# Patient Record
Sex: Female | Born: 1955 | Race: White | Hispanic: No | Smoking: Former smoker
Health system: Southern US, Community
[De-identification: ages and names within clinical notes are randomized; demographics above are authoritative.]

## PROBLEM LIST (undated history)

## (undated) DIAGNOSIS — I10 Essential (primary) hypertension: Secondary | ICD-10-CM

## (undated) DIAGNOSIS — E079 Disorder of thyroid, unspecified: Secondary | ICD-10-CM

## (undated) DIAGNOSIS — E039 Hypothyroidism, unspecified: Secondary | ICD-10-CM

## (undated) DIAGNOSIS — T7840XA Allergy, unspecified, initial encounter: Secondary | ICD-10-CM

## (undated) HISTORY — DX: Disorder of thyroid, unspecified: E07.9

## (undated) HISTORY — DX: Allergy, unspecified, initial encounter: T78.40XA

## (undated) HISTORY — DX: Essential (primary) hypertension: I10

---

## 1998-09-22 ENCOUNTER — Other Ambulatory Visit: Admission: RE | Admit: 1998-09-22 | Discharge: 1998-09-22 | Payer: Self-pay | Admitting: Obstetrics and Gynecology

## 1999-11-11 ENCOUNTER — Other Ambulatory Visit: Admission: RE | Admit: 1999-11-11 | Discharge: 1999-11-11 | Payer: Self-pay | Admitting: Obstetrics and Gynecology

## 2000-11-15 ENCOUNTER — Other Ambulatory Visit: Admission: RE | Admit: 2000-11-15 | Discharge: 2000-11-15 | Payer: Self-pay | Admitting: Obstetrics and Gynecology

## 2001-12-26 ENCOUNTER — Other Ambulatory Visit: Admission: RE | Admit: 2001-12-26 | Discharge: 2001-12-26 | Payer: Self-pay | Admitting: Obstetrics and Gynecology

## 2005-04-14 ENCOUNTER — Ambulatory Visit: Payer: Self-pay | Admitting: Family Medicine

## 2006-04-18 ENCOUNTER — Ambulatory Visit: Payer: Self-pay | Admitting: Family Medicine

## 2007-05-11 ENCOUNTER — Ambulatory Visit: Payer: Self-pay | Admitting: Family Medicine

## 2008-06-13 ENCOUNTER — Ambulatory Visit: Payer: Self-pay | Admitting: Family Medicine

## 2008-07-11 ENCOUNTER — Ambulatory Visit: Payer: Self-pay | Admitting: Gastroenterology

## 2008-07-11 LAB — HM COLONOSCOPY: HM Colonoscopy: NORMAL

## 2009-06-24 ENCOUNTER — Ambulatory Visit: Payer: Self-pay | Admitting: Family Medicine

## 2009-07-02 ENCOUNTER — Ambulatory Visit: Payer: Self-pay | Admitting: Family Medicine

## 2010-07-28 ENCOUNTER — Ambulatory Visit: Payer: Self-pay | Admitting: Family Medicine

## 2010-09-28 ENCOUNTER — Ambulatory Visit: Payer: Self-pay | Admitting: Unknown Physician Specialty

## 2010-10-01 HISTORY — PX: BILATERAL CARPAL TUNNEL RELEASE: SHX6508

## 2011-07-29 ENCOUNTER — Ambulatory Visit: Payer: Self-pay | Admitting: Family Medicine

## 2012-08-01 ENCOUNTER — Ambulatory Visit: Payer: Self-pay | Admitting: Family Medicine

## 2012-10-04 LAB — HM PAP SMEAR: HM Pap smear: NEGATIVE

## 2013-08-02 ENCOUNTER — Ambulatory Visit: Payer: Self-pay | Admitting: Family Medicine

## 2013-10-19 LAB — HEPATIC FUNCTION PANEL
ALT: 17 U/L (ref 7–35)
AST: 15 U/L (ref 13–35)

## 2013-10-19 LAB — LIPID PANEL
Cholesterol: 245 mg/dL — AB (ref 0–200)
HDL: 74 mg/dL — AB (ref 35–70)

## 2014-08-06 ENCOUNTER — Ambulatory Visit: Payer: Self-pay | Admitting: Family Medicine

## 2014-08-06 LAB — HM MAMMOGRAPHY

## 2014-08-24 DIAGNOSIS — E669 Obesity, unspecified: Secondary | ICD-10-CM | POA: Insufficient documentation

## 2014-08-24 DIAGNOSIS — N390 Urinary tract infection, site not specified: Secondary | ICD-10-CM | POA: Insufficient documentation

## 2014-08-24 DIAGNOSIS — Z0000 Encounter for general adult medical examination without abnormal findings: Secondary | ICD-10-CM | POA: Insufficient documentation

## 2014-08-24 DIAGNOSIS — E0500 Thyrotoxicosis with diffuse goiter without thyrotoxic crisis or storm: Secondary | ICD-10-CM | POA: Insufficient documentation

## 2014-08-24 DIAGNOSIS — E559 Vitamin D deficiency, unspecified: Secondary | ICD-10-CM | POA: Insufficient documentation

## 2014-08-24 DIAGNOSIS — I10 Essential (primary) hypertension: Secondary | ICD-10-CM | POA: Insufficient documentation

## 2014-08-24 DIAGNOSIS — B019 Varicella without complication: Secondary | ICD-10-CM | POA: Insufficient documentation

## 2014-10-24 ENCOUNTER — Ambulatory Visit (INDEPENDENT_AMBULATORY_CARE_PROVIDER_SITE_OTHER): Payer: 59 | Admitting: Family Medicine

## 2014-10-24 ENCOUNTER — Encounter: Payer: Self-pay | Admitting: Family Medicine

## 2014-10-24 VITALS — BP 124/82 | HR 84 | Temp 98.0°F | Resp 16 | Ht 63.0 in | Wt 193.0 lb

## 2014-10-24 DIAGNOSIS — I10 Essential (primary) hypertension: Secondary | ICD-10-CM

## 2014-10-24 DIAGNOSIS — R002 Palpitations: Secondary | ICD-10-CM | POA: Diagnosis not present

## 2014-10-24 DIAGNOSIS — E0500 Thyrotoxicosis with diffuse goiter without thyrotoxic crisis or storm: Secondary | ICD-10-CM

## 2014-10-24 DIAGNOSIS — Z0000 Encounter for general adult medical examination without abnormal findings: Secondary | ICD-10-CM | POA: Diagnosis not present

## 2014-10-24 DIAGNOSIS — E559 Vitamin D deficiency, unspecified: Secondary | ICD-10-CM | POA: Diagnosis not present

## 2014-10-24 DIAGNOSIS — Z23 Encounter for immunization: Secondary | ICD-10-CM | POA: Diagnosis not present

## 2014-10-24 LAB — POCT URINALYSIS DIPSTICK
Bilirubin, UA: NEGATIVE
Glucose, UA: NEGATIVE
Ketones, UA: NEGATIVE
LEUKOCYTES UA: NEGATIVE
Nitrite, UA: NEGATIVE
Protein, UA: NEGATIVE
RBC UA: NEGATIVE
Spec Grav, UA: <=1.005
Urobilinogen, UA: 0.2
pH, UA: 6

## 2014-10-24 LAB — IFOBT (OCCULT BLOOD): IFOBT: NEGATIVE

## 2014-10-24 NOTE — Progress Notes (Signed)
Subjective:    Patient ID: Vanessa Austin, female    DOB: 10-27-1955, 59 y.o.   MRN: [id number]  HPI   59 year old female pt comes in today for her Annual Physical Exam.  She reports feeling well with minor issues.  She has a history of heart palpitations.  She denies any changes or worsening symptoms but is considering a cardiology referral for further work up just.  She is sleeping well and has good energy.    Review of Systems  Constitutional: Negative.   HENT: Negative.   Eyes: Negative.   Cardiovascular: Positive for palpitations (Chronic Issues, hasn't had any changes.).  Gastrointestinal: Negative.   Endocrine: Negative.   Genitourinary: Negative.   Musculoskeletal: Positive for joint swelling and arthralgias.  Skin: Negative.   Allergic/Immunologic: Positive for environmental allergies.  Neurological: Negative.   Hematological: Negative.   Psychiatric/Behavioral: Negative.    Family History  Problem Relation Age of Onset  . Hypertension Mother   . Brain cancer Mother   . Hypertension Father   . Hypertension Brother    History   Social History  . Marital Status: Married    Spouse Name: Nadara Mustard  . Number of Children: 3  . Years of Education: College   Occupational History  . [employer]   Social History Main Topics  . Smoking status: Never Smoker   . Smokeless tobacco: Never Used  . Alcohol Use: 4.2 oz/week    7 Glasses of wine per week  . Drug Use: No  . Sexual Activity: Yes   Other Topics Concern  . Not on file   Social History Narrative     Past Surgical History  Procedure Laterality Date  . Bilateral carpal tunnel release Bilateral 10/01/2010    Dr. Leanor Kail     Allergies  Allergen Reactions  . Etodolac   . Penicillins   . Sulfa Antibiotics   . Meloxicam Rash    Current Outpatient Prescriptions on File Prior to Visit  Medication Sig Dispense Refill  . Cetirizine HCl 10 MG CAPS Take 10 mg by mouth daily.    .  Cholecalciferol 10000 UNITS CAPS Take by mouth daily.    . fluticasone (FLONASE) 50 MCG/ACT nasal spray Place 2 sprays into the nose daily.    Marland Kitchen levothyroxine (SYNTHROID, LEVOTHROID) 150 MCG tablet Take 150 mcg by mouth.    Marland Kitchen lisinopril (PRINIVIL,ZESTRIL) 5 MG tablet Take 5 mg by mouth.     No current facility-administered medications on file prior to visit.   Blood pressure 124/82, pulse 84, temperature 98 F (36.7 C), temperature source Oral, resp. rate 16, height _0  (1.6 m), weight 193 lb (87.544 kg).        Objective:   Physical Exam  Constitutional: She appears well-developed and well-nourished.  HENT:  Head: Normocephalic and atraumatic.  Right Ear: Hearing, tympanic membrane, external ear and ear canal normal.  Left Ear: Hearing, tympanic membrane, external ear and ear canal normal.  Nose: Nose normal.  Mouth/Throat: Uvula is midline, oropharynx is clear and moist and mucous membranes are normal.  Eyes: Lids are normal.  Neck: Trachea normal and normal range of motion. Carotid bruit is not present. No thyroid mass and no thyromegaly present.  Cardiovascular: Normal rate, regular rhythm and normal heart sounds.   Pulmonary/Chest: Effort normal and breath sounds normal. Right breast exhibits no inverted nipple, no mass, no nipple discharge, no skin change and no tenderness. Left breast exhibits  mass. Left breast exhibits no inverted nipple, no nipple discharge, no skin change and no tenderness. Breasts are symmetrical.  Abdominal: Soft. Normal appearance and bowel sounds are normal.  Genitourinary: Rectum normal. Guaiac negative stool.  Neurological: She is alert. She has normal strength.  Skin: Skin is warm, dry and intact.  Psychiatric: She has a normal mood and affect. Her behavior is normal.          Assessment & Plan:  1. Annual physical Exam- Healthy female, encouraged healthy diet and exercise.  - POCT Urinalysis Dipstick - IFOBT POC (occult bld, rslt in  office) - Tdap vaccine greater than or equal to 7yo IM  2. Graves disease Check labs. - TSH  3. Vitamin D deficiency Check labs - Vitamin D (25 hydroxy)  4. Essential hypertension Condition is stable. Please continue current medication and  plan of care as noted.   - CBC w/Diff - Comp Met (CMET) - Lipid Profile  5. Heart palpitations Has not had work up previously. Will refer.  - EKG 12-Lead - Ambulatory referral to Cardiology

## 2014-10-29 LAB — CBC WITH DIFFERENTIAL/PLATELET
BASOS ABS: 0 10*3/uL (ref 0.0–0.2)
Basos: 1 %
EOS (ABSOLUTE): 0.1 10*3/uL (ref 0.0–0.4)
Eos: 3 %
HEMOGLOBIN: 13.5 g/dL (ref 11.1–15.9)
Hematocrit: 39.5 % (ref 34.0–46.6)
IMMATURE GRANULOCYTES: 0 %
Immature Grans (Abs): 0 10*3/uL (ref 0.0–0.1)
Lymphocytes Absolute: 1.4 10*3/uL (ref 0.7–3.1)
Lymphs: 31 %
MCH: 30.3 pg (ref 26.6–33.0)
MCHC: 34.2 g/dL (ref 31.5–35.7)
MCV: 89 fL (ref 79–97)
MONOCYTES: 10 %
MONOS ABS: 0.4 10*3/uL (ref 0.1–0.9)
Neutrophils Absolute: 2.6 10*3/uL (ref 1.4–7.0)
Neutrophils: 55 %
Platelets: 263 10*3/uL (ref 150–379)
RBC: 4.45 x10E6/uL (ref 3.77–5.28)
RDW: 13.2 % (ref 12.3–15.4)
WBC: 4.6 10*3/uL (ref 3.4–10.8)

## 2014-10-30 ENCOUNTER — Other Ambulatory Visit: Payer: Self-pay

## 2014-10-30 DIAGNOSIS — E039 Hypothyroidism, unspecified: Secondary | ICD-10-CM | POA: Insufficient documentation

## 2014-10-30 LAB — LIPID PANEL
Chol/HDL Ratio: 3.1 ratio units (ref 0.0–4.4)
Cholesterol, Total: 234 mg/dL — ABNORMAL HIGH (ref 100–199)
HDL: 75 mg/dL (ref >39)
LDL CALC: 143 mg/dL — AB (ref 0–99)
Triglycerides: 82 mg/dL (ref 0–149)
VLDL Cholesterol Cal: 16 mg/dL (ref 5–40)

## 2014-10-30 LAB — TSH: TSH: 0.092 u[IU]/mL — ABNORMAL LOW (ref 0.450–4.500)

## 2014-10-30 LAB — COMPREHENSIVE METABOLIC PANEL
ALBUMIN: 4.3 g/dL (ref 3.5–5.5)
ALT: 19 IU/L (ref 0–32)
AST: 15 IU/L (ref 0–40)
Albumin/Globulin Ratio: 2 (ref 1.1–2.5)
Alkaline Phosphatase: 54 IU/L (ref 39–117)
BUN / CREAT RATIO: 14 (ref 9–23)
BUN: 12 mg/dL (ref 6–24)
Bilirubin Total: 0.4 mg/dL (ref 0.0–1.2)
CHLORIDE: 105 mmol/L (ref 97–108)
CO2: 22 mmol/L (ref 18–29)
Calcium: 9.6 mg/dL (ref 8.7–10.2)
Creatinine, Ser: 0.83 mg/dL (ref 0.57–1.00)
GFR calc Af Amer: 91 mL/min/{1.73_m2} (ref >59)
GFR, EST NON AFRICAN AMERICAN: 79 mL/min/{1.73_m2} (ref >59)
GLUCOSE: 103 mg/dL — AB (ref 65–99)
Globulin, Total: 2.1 g/dL (ref 1.5–4.5)
POTASSIUM: 4.9 mmol/L (ref 3.5–5.2)
SODIUM: 142 mmol/L (ref 134–144)
Total Protein: 6.4 g/dL (ref 6.0–8.5)

## 2014-10-30 LAB — VITAMIN D 25 HYDROXY (VIT D DEFICIENCY, FRACTURES): Vit D, 25-Hydroxy: 36.1 ng/mL (ref 30.0–100.0)

## 2014-10-30 MED ORDER — LEVOTHYROXINE SODIUM 137 MCG PO TABS: 137.0000 ug | ORAL_TABLET | Freq: Every day | ORAL | Status: DC

## 2014-10-30 NOTE — Telephone Encounter (Signed)
-----   Message from Margarita Rana, MD sent at 10/30/2014  6:22 AM EDT ----- Labs stable. Cholesterol elevated but balanced by high good cholesterol.  Thyroid is overcorrected. May contribute to increased heart rate. Recommend decrease Synthroid to 137 and recheck in 8 weeks.  Please clarify pharmacy. Thanks.

## 2014-10-30 NOTE — Telephone Encounter (Signed)
LMTCB 10/30/2014  Thanks,   -Mickel Baas

## 2014-10-30 NOTE — Telephone Encounter (Signed)
Pt advised [facility] to [facility] [address].   Thanks,   -Mickel Baas

## 2014-10-30 NOTE — Telephone Encounter (Signed)
-----   Message from Margarita Rana, MD sent at 10/29/2014 10:35 PM EDT ----- CBC negative.  Please notify patient. Thanks.

## 2014-12-05 ENCOUNTER — Ambulatory Visit: Payer: Self-pay | Admitting: Cardiovascular Disease

## 2014-12-23 ENCOUNTER — Telehealth: Payer: Self-pay | Admitting: Family Medicine

## 2014-12-23 DIAGNOSIS — E039 Hypothyroidism, unspecified: Secondary | ICD-10-CM

## 2014-12-23 NOTE — Telephone Encounter (Signed)
Pt wants to come by and pick up lab order for thyroid text.  Please cal her back when it is ready.   [phone]

## 2014-12-24 NOTE — Telephone Encounter (Signed)
Left message advising pt.   Thanks,   -Raidyn Breiner  

## 2014-12-24 NOTE — Telephone Encounter (Signed)
Printed.  Please notify patient. Thanks.  

## 2015-01-07 ENCOUNTER — Encounter: Payer: Self-pay | Admitting: Cardiovascular Disease

## 2015-01-07 ENCOUNTER — Encounter (INDEPENDENT_AMBULATORY_CARE_PROVIDER_SITE_OTHER): Payer: Self-pay

## 2015-01-07 ENCOUNTER — Ambulatory Visit (INDEPENDENT_AMBULATORY_CARE_PROVIDER_SITE_OTHER): Payer: 59 | Admitting: Cardiovascular Disease

## 2015-01-07 VITALS — BP 127/90 | HR 74 | Ht 62.5 in | Wt 192.5 lb

## 2015-01-07 DIAGNOSIS — R002 Palpitations: Secondary | ICD-10-CM

## 2015-01-07 NOTE — Progress Notes (Signed)
Primary care physician: Dr. Venia Minks  HPI  This is a pleasant 59 year old female who was referred for evaluation of palpitations. She has no previous cardiac history. She has known history of hypothyroidism on replacement, mild hypertension and hyperlipidemia. In June, she started having palpitations mostly at night described as skipping in her heart with occasional tachycardia. She was taking Synthroid 150 g daily. Her labs showed suppressed TSH. The dose was decreased to 137 g daily. Since that time, the patient reports complete resolution of symptoms. She denies any chest pain. She has very mild exertional dyspnea with no recent worsening. There is no family history of coronary artery disease although her father had aortic dissection and CABG later in life. She is not a smoker. She works at [employer].  Allergies  Allergen Reactions  . Etodolac   . Penicillins   . Sulfa Antibiotics   . Meloxicam Rash     Current Outpatient Prescriptions on File Prior to Visit  Medication Sig Dispense Refill  . Cetirizine HCl 10 MG CAPS Take 10 mg by mouth daily.    . Cholecalciferol 10000 UNITS CAPS Take by mouth daily.    . fluticasone (FLONASE) 50 MCG/ACT nasal spray Place 2 sprays into the nose daily.    Marland Kitchen lisinopril (PRINIVIL,ZESTRIL) 5 MG tablet Take 5 mg by mouth.     No current facility-administered medications on file prior to visit.     Past Medical History  Diagnosis Date  . Thyroid disease   . Allergy   . Hypertension      Past Surgical History  Procedure Laterality Date  . Bilateral carpal tunnel release Bilateral 10/01/2010    Dr. Leanor Kail     Family History  Problem Relation Age of Onset  . Hypertension Mother   . Brain cancer Mother   . Hypertension Father   . Heart disease Father   . Hypertension Brother      Social History   Social History  . Marital Status: Married    Spouse Name: Nadara Mustard  . Number of Children: 3  . Years of Education: College    Occupational History  . [employer]   Social History Main Topics  . Smoking status: Never Smoker   . Smokeless tobacco: Never Used  . Alcohol Use: 4.2 oz/week    7 Glasses of wine per week  . Drug Use: No  . Sexual Activity: Yes   Other Topics Concern  . Not on file   Social History Narrative     ROS A 10 point review of system was performed. It is negative other than that mentioned in the history of present illness.   PHYSICAL EXAM   BP 127/90 mmHg  Pulse 74  Ht 5' 2.5" (1.588 m)  Wt 192 lb 8 oz (87.317 kg)  BMI 34.63 kg/m2 Constitutional: She is oriented to person, place, and time. She appears well-developed and well-nourished. No distress.  HENT: No nasal discharge.  Head: Normocephalic and atraumatic.  Eyes: Pupils are equal and round. No discharge.  Neck: Normal range of motion. Neck supple. No JVD present. No thyromegaly present.  Cardiovascular: Normal rate, regular rhythm, normal heart sounds. Exam reveals no gallop and no friction rub. No murmur heard.  Pulmonary/Chest: Effort normal and breath sounds normal. No stridor. No respiratory distress. She has no wheezes. She has no rales. She exhibits no tenderness.  Abdominal: Soft. Bowel sounds are normal. She exhibits no distension. There is no tenderness. There is  no rebound and no guarding.  Musculoskeletal: Normal range of motion. She exhibits no edema and no tenderness.  Neurological: She is alert and oriented to person, place, and time. Coordination normal.  Skin: Skin is warm and dry. No rash noted. She is not diaphoretic. No erythema. No pallor.  Psychiatric: She has a normal mood and affect. Her behavior is normal. Judgment and thought content normal.     EKG: Sinus  Rhythm  WITHIN NORMAL LIMITS   ASSESSMENT AND PLAN

## 2015-01-07 NOTE — Patient Instructions (Signed)
Medication Instructions: Continue same medications.   Labwork: None.   Procedures/Testing: None.   Follow-Up: As needed.   Any Additional Special Instructions Will Be Listed Below (If Applicable).

## 2015-01-07 NOTE — Assessment & Plan Note (Signed)
Was likely due to iatrogenic hyperthyroidism. The symptoms completely resolved after the dose of Synthroid was decreased. She currently reports no palpitations and no other cardiac symptoms. Her cardiac physical exam is normal and baseline ECG is also normal. Thus, the utility of further cardiac testing is very low. If she develops recurrent symptoms after optimizing her thyroid function, then a Holter monitor and echocardiogram can be considered. The patient can follow-up with me as needed.

## 2015-01-08 ENCOUNTER — Telehealth: Payer: Self-pay

## 2015-01-08 DIAGNOSIS — E039 Hypothyroidism, unspecified: Secondary | ICD-10-CM

## 2015-01-08 LAB — TSH: TSH: 0.229 u[IU]/mL — ABNORMAL LOW (ref 0.450–4.500)

## 2015-01-08 MED ORDER — LEVOTHYROXINE SODIUM 125 MCG PO TABS: 125.0000 ug | ORAL_TABLET | Freq: Every day | ORAL | Status: DC

## 2015-01-08 NOTE — Telephone Encounter (Signed)
Advised patient of lab results. Patient verbally understands. Discontinued levothyroxine 15mcg and sent in levothyroxine 125mg  [facility] [address]

## 2015-01-08 NOTE — Telephone Encounter (Signed)
-----   Message from Margarita Rana, MD sent at 01/08/2015  7:36 AM EDT ----- Thyroid improved but not at goal. Recommend decrease levothyroxine to 125 mcg and recheck again in 6 weeks.  Please also clarify pharmacy and put in rx.  Thanks.

## 2015-01-14 ENCOUNTER — Ambulatory Visit: Payer: Self-pay | Admitting: Cardiovascular Disease

## 2015-03-03 ENCOUNTER — Encounter: Payer: Self-pay | Admitting: Family Medicine

## 2015-03-03 DIAGNOSIS — E038 Other specified hypothyroidism: Secondary | ICD-10-CM

## 2015-03-08 ENCOUNTER — Encounter: Payer: Self-pay | Admitting: Family Medicine

## 2015-03-08 LAB — T4 AND TSH
T4 TOTAL: 9.5 ug/dL (ref 4.5–12.0)
TSH: 0.77 u[IU]/mL (ref 0.450–4.500)

## 2015-03-10 ENCOUNTER — Other Ambulatory Visit: Payer: Self-pay

## 2015-03-10 DIAGNOSIS — E039 Hypothyroidism, unspecified: Secondary | ICD-10-CM

## 2015-03-10 MED ORDER — LEVOTHYROXINE SODIUM 125 MCG PO TABS: 125.0000 ug | ORAL_TABLET | Freq: Every day | ORAL | Status: DC

## 2015-03-10 NOTE — Telephone Encounter (Signed)
-----   Message from Margarita Rana, MD sent at 03/08/2015  9:35 AM EDT ----- Thyroid normal.  Thanks.

## 2015-03-10 NOTE — Addendum Note (Signed)
Addended by: Ashley Royalty E on: 03/10/2015 05:02 PM   Modules accepted: Orders

## 2015-05-20 ENCOUNTER — Other Ambulatory Visit: Payer: Self-pay | Admitting: Family Medicine

## 2015-05-20 DIAGNOSIS — E039 Hypothyroidism, unspecified: Secondary | ICD-10-CM

## 2015-05-20 NOTE — Telephone Encounter (Signed)
T4 and TSH checked on 03/07/2015; WNL. Renaldo Fiddler, CMA

## 2015-05-24 ENCOUNTER — Encounter: Payer: Self-pay | Admitting: Family Medicine

## 2015-05-27 MED ORDER — DOXYCYCLINE HYCLATE 100 MG PO TABS: 100.0000 mg | ORAL_TABLET | Freq: Two times a day (BID) | ORAL | Status: DC

## 2015-09-12 ENCOUNTER — Telehealth: Payer: Self-pay | Admitting: Emergency Medicine

## 2015-09-12 NOTE — Telephone Encounter (Signed)
Ok to put in 11 am slot on Tuesday or Thursday. Thanks.

## 2015-09-12 NOTE — Telephone Encounter (Signed)
Pt called requesting a CPE appt with Dr. Venia Minks before she leaves. Looks like all her slots are filled for CPE until she is gone. She wants to know if she can be worked in or if she needs to just make a normal follow up appointment with her. Please advise.   CB# [phone]

## 2015-09-12 NOTE — Telephone Encounter (Signed)
Please review. Thanks!  

## 2015-09-12 NOTE — Telephone Encounter (Signed)
Apt 10/28/2015 at 11am   Thanks,   -Mickel Baas

## 2015-10-28 ENCOUNTER — Ambulatory Visit: Payer: Self-pay | Admitting: Family Medicine

## 2015-11-05 ENCOUNTER — Other Ambulatory Visit: Payer: Self-pay | Admitting: Family Medicine

## 2015-11-12 ENCOUNTER — Encounter: Payer: Self-pay | Admitting: Family Medicine

## 2015-11-12 ENCOUNTER — Ambulatory Visit (INDEPENDENT_AMBULATORY_CARE_PROVIDER_SITE_OTHER): Payer: 59 | Admitting: Family Medicine

## 2015-11-12 VITALS — BP 116/82 | HR 72 | Temp 97.5°F | Resp 16 | Ht 63.5 in | Wt 199.0 lb

## 2015-11-12 DIAGNOSIS — Z124 Encounter for screening for malignant neoplasm of cervix: Secondary | ICD-10-CM | POA: Diagnosis not present

## 2015-11-12 DIAGNOSIS — E7800 Pure hypercholesterolemia, unspecified: Secondary | ICD-10-CM

## 2015-11-12 DIAGNOSIS — Z0000 Encounter for general adult medical examination without abnormal findings: Secondary | ICD-10-CM

## 2015-11-12 DIAGNOSIS — Z1231 Encounter for screening mammogram for malignant neoplasm of breast: Secondary | ICD-10-CM | POA: Diagnosis not present

## 2015-11-12 DIAGNOSIS — Z126 Encounter for screening for malignant neoplasm of bladder: Secondary | ICD-10-CM

## 2015-11-12 DIAGNOSIS — I10 Essential (primary) hypertension: Secondary | ICD-10-CM | POA: Diagnosis not present

## 2015-11-12 DIAGNOSIS — Z1211 Encounter for screening for malignant neoplasm of colon: Secondary | ICD-10-CM | POA: Diagnosis not present

## 2015-11-12 DIAGNOSIS — E039 Hypothyroidism, unspecified: Secondary | ICD-10-CM

## 2015-11-12 DIAGNOSIS — E559 Vitamin D deficiency, unspecified: Secondary | ICD-10-CM | POA: Diagnosis not present

## 2015-11-12 LAB — POCT URINALYSIS DIPSTICK
Bilirubin, UA: NEGATIVE
Glucose, UA: NEGATIVE
KETONES UA: NEGATIVE
Leukocytes, UA: NEGATIVE
Nitrite, UA: NEGATIVE
PH UA: 6
PROTEIN UA: NEGATIVE
RBC UA: NEGATIVE
SPEC GRAV UA: 1.015
UROBILINOGEN UA: 0.2

## 2015-11-12 LAB — IFOBT (OCCULT BLOOD): IMMUNOLOGICAL FECAL OCCULT BLOOD TEST: NEGATIVE

## 2015-11-12 NOTE — Progress Notes (Signed)
Patient: Vanessa Austin, Female    DOB: 1955/08/09, 60 y.o.   MRN: [id number] Visit Date: 11/12/2015  Today's Provider: Margarita Rana, MD   Chief Complaint  Patient presents with  . Annual Exam   Subjective:    Annual physical exam Vanessa Austin is a 60 y.o. female who presents today for health maintenance and complete physical. She feels well. She reports exercising not currently. She reports she is sleeping well.  Last CPE- 10/24/2014 Last Mammogram- 08/06/2014- BI-RADS 1 Last pap- 10/04/2012- Negative; HPV Negative Last colonoscopy- 07/03/2008- Dr. Allen Norris. Normal.  -----------------------------------------------------------------   Review of Systems  Constitutional: Negative.   HENT: Negative.   Eyes: Negative.   Respiratory: Negative.   Cardiovascular: Negative.   Gastrointestinal: Negative.   Endocrine: Negative.   Genitourinary: Negative.   Musculoskeletal: Negative.   Skin: Negative.   Allergic/Immunologic: Negative.   Neurological: Negative.   Hematological: Negative.   Psychiatric/Behavioral: Negative.     Social History      She  reports that she quit smoking about 35 years ago. She has never used smokeless tobacco. She reports that she drinks about 4.2 oz of alcohol per week. She reports that she does not use illicit drugs.       Social History   Social History  . Marital Status: Married    Spouse Name: Nadara Mustard  . Number of Children: 3  . Years of Education: College   Occupational History  . [employer]   Social History Main Topics  . Smoking status: Former Smoker -- 0.25 packs/day for 5 years    Quit date: 05/23/1980  . Smokeless tobacco: Never Used  . Alcohol Use: 4.2 oz/week    7 Glasses of wine per week  . Drug Use: No  . Sexual Activity: Yes   Other Topics Concern  . None   Social History Narrative    Past Medical History  Diagnosis Date  . Thyroid disease   . Allergy   . Hypertension      Patient  Active Problem List   Diagnosis Date Noted  . Hypercholesterolemia 11/12/2015  . Palpitations 01/07/2015  . Hypothyroidism 10/30/2014  . Routine general medical examination at a health care facility 08/24/2014  . Chicken pox 08/24/2014  . 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 BP (high blood pressure) 08/24/2014  . Adiposity 08/24/2014  . Avitaminosis D 08/24/2014  . Carpal tunnel syndrome 12/12/2009  . Female genuine stress incontinence 06/12/2008    Past Surgical History  Procedure Laterality Date  . Bilateral carpal tunnel release Bilateral 10/01/2010    Dr. Leanor Kail    Family History        Family Status  Relation Status Death Age  . Mother Deceased 32  . Father Alive   . Brother Alive   . Brother Alive         Her family history includes Brain cancer in her mother; Heart disease in her father; Hypertension in her brother, father, and mother.    Allergies  Allergen Reactions  . Etodolac   . Penicillins   . Sulfa Antibiotics   . Meloxicam Rash    Current Meds  Medication Sig  . Cetirizine HCl 10 MG CAPS Take 10 mg by mouth daily.  . Cholecalciferol 10000 UNITS CAPS Take by mouth daily.  . fluticasone (FLONASE) 50 MCG/ACT nasal spray Place 2 sprays into the nose daily.  Marland Kitchen levothyroxine (SYNTHROID, LEVOTHROID) 125 MCG tablet take 1  tablet by mouth once daily  . lisinopril (PRINIVIL,ZESTRIL) 5 MG tablet take 1 tablet by mouth once daily    Patient Care Team: Margarita Rana, MD as PCP - General (Family Medicine)     Objective:   Vitals: BP 116/82 mmHg  Pulse 72  Temp(Src) 97.5 F (36.4 C) (Oral)  Resp 16  Ht 5' 3.5" (1.613 m)  Wt 199 lb (90.266 kg)  BMI 34.69 kg/m2   Physical Exam  Constitutional: She is oriented to person, place, and time. She appears well-developed and well-nourished.  HENT:  Head: Normocephalic and atraumatic.  Right Ear: Tympanic membrane, external ear and ear canal normal.  Left Ear: Tympanic membrane, external ear and ear canal  normal.  Nose: Nose normal.  Mouth/Throat: Uvula is midline, oropharynx is clear and moist and mucous membranes are normal.  Eyes: Conjunctivae, EOM and lids are normal. Pupils are equal, round, and reactive to light.  Neck: Trachea normal and normal range of motion. Neck supple. Carotid bruit is not present. No thyroid mass and no thyromegaly present.  Cardiovascular: Normal rate, regular rhythm and normal heart sounds.   Pulmonary/Chest: Effort normal and breath sounds normal.  Abdominal: Soft. Normal appearance and bowel sounds are normal. There is no hepatosplenomegaly. There is no tenderness.  Genitourinary: Vagina normal and uterus normal. Guaiac negative stool. No breast swelling, tenderness or discharge.  Musculoskeletal: Normal range of motion.  Lymphadenopathy:    She has no cervical adenopathy.    She has no axillary adenopathy.  Neurological: She is alert and oriented to person, place, and time. She has normal strength. No cranial nerve deficit.  Skin: Skin is warm, dry and intact.  Psychiatric: She has a normal mood and affect. Her speech is normal and behavior is normal. Judgment and thought content normal. Cognition and memory are normal.     Depression Screen PHQ 2/9 Scores 11/12/2015  PHQ - 2 Score 0      Assessment & Plan:     Routine Health Maintenance and Physical Exam  Exercise Activities and Dietary recommendations Goals    None      Immunization History  Administered Date(s) Administered  . Influenza,inj,Quad PF,36+ Mos 05/13/2015  . Td 04/09/2004  . Tdap 10/24/2014      Discussed health benefits of physical activity, and encouraged her to engage in regular exercise appropriate for her age and condition.    --------------------------------------------------------------------  1. Annual physical exam Stable. Increase exercise.  Results for orders placed or performed in visit on 11/12/15  POCT urinalysis dipstick  Result Value Ref Range    Color, UA yellow    Clarity, UA clear    Glucose, UA Negative    Bilirubin, UA Negative    Ketones, UA Negative    Spec Grav, UA 1.015    Blood, UA Negative    pH, UA 6.0    Protein, UA Negative    Urobilinogen, UA 0.2    Nitrite, UA Negative    Leukocytes, UA Negative Negative  IFOBT POC (occult bld, rslt in office)  Result Value Ref Range   IFOBT Negative      2. Essential hypertension Stable. FU pending labs. - CBC with Differential/Platelet - Comprehensive metabolic panel  3. Hypothyroidism, unspecified hypothyroidism type FU pending labs. - T4 AND TSH  4. Hypercholesterolemia FU pending results. - Lipid panel  5. Bladder cancer screening UA negative. - POCT urinalysis dipstick  6. Encounter for screening mammogram for breast cancer Mammogram ordered today. - MM DIGITAL SCREENING  BILATERAL; Future  7. Avitaminosis D FU pending results. - VITAMIN D 25 Hydroxy (Vit-D Deficiency, Fractures)  8. Cervical cancer screening DU pending results. - Pap IG and HPV (high risk) DNA detection  9. Colon cancer screening OC Lyte negative. - IFOBT POC (occult bld, rslt in office)   Patient seen and examined by Jerrell Belfast, MD, and note scribed by Renaldo Fiddler, CMA.   I have reviewed the document for accuracy and completeness and I agree with above. - Jerrell Belfast, MD    Margarita Rana, MD  [facility]

## 2015-11-14 ENCOUNTER — Encounter: Payer: Self-pay | Admitting: Family Medicine

## 2015-11-14 ENCOUNTER — Telehealth: Payer: Self-pay

## 2015-11-14 NOTE — Telephone Encounter (Signed)
Patient called and wanted to let you know she did labs today and when you send in RX refills for her she needs it 90 day supply please.-aa

## 2015-11-15 LAB — COMPREHENSIVE METABOLIC PANEL
ALBUMIN: 4.6 g/dL (ref 3.5–5.5)
ALK PHOS: 54 IU/L (ref 39–117)
ALT: 24 IU/L (ref 0–32)
AST: 18 IU/L (ref 0–40)
Albumin/Globulin Ratio: 1.8 (ref 1.2–2.2)
BILIRUBIN TOTAL: 0.3 mg/dL (ref 0.0–1.2)
BUN/Creatinine Ratio: 17 (ref 9–23)
BUN: 15 mg/dL (ref 6–24)
CHLORIDE: 102 mmol/L (ref 96–106)
CO2: 22 mmol/L (ref 18–29)
CREATININE: 0.9 mg/dL (ref 0.57–1.00)
Calcium: 9.8 mg/dL (ref 8.7–10.2)
GFR calc Af Amer: 82 mL/min/{1.73_m2} (ref >59)
GFR calc non Af Amer: 71 mL/min/{1.73_m2} (ref >59)
GLUCOSE: 94 mg/dL (ref 65–99)
Globulin, Total: 2.5 g/dL (ref 1.5–4.5)
Potassium: 4.7 mmol/L (ref 3.5–5.2)
Sodium: 141 mmol/L (ref 134–144)
Total Protein: 7.1 g/dL (ref 6.0–8.5)

## 2015-11-15 LAB — LIPID PANEL
CHOLESTEROL TOTAL: 271 mg/dL — AB (ref 100–199)
Chol/HDL Ratio: 3.3 ratio units (ref 0.0–4.4)
HDL: 83 mg/dL (ref >39)
LDL CALC: 171 mg/dL — AB (ref 0–99)
Triglycerides: 86 mg/dL (ref 0–149)
VLDL Cholesterol Cal: 17 mg/dL (ref 5–40)

## 2015-11-15 LAB — CBC WITH DIFFERENTIAL/PLATELET
BASOS ABS: 0 10*3/uL (ref 0.0–0.2)
Basos: 1 %
EOS (ABSOLUTE): 0.1 10*3/uL (ref 0.0–0.4)
Eos: 1 %
HEMOGLOBIN: 14 g/dL (ref 11.1–15.9)
Hematocrit: 41.7 % (ref 34.0–46.6)
Immature Grans (Abs): 0 10*3/uL (ref 0.0–0.1)
Immature Granulocytes: 0 %
LYMPHS ABS: 1.5 10*3/uL (ref 0.7–3.1)
Lymphs: 30 %
MCH: 30.4 pg (ref 26.6–33.0)
MCHC: 33.6 g/dL (ref 31.5–35.7)
MCV: 91 fL (ref 79–97)
MONOCYTES: 8 %
Monocytes Absolute: 0.4 10*3/uL (ref 0.1–0.9)
Neutrophils Absolute: 3.1 10*3/uL (ref 1.4–7.0)
Neutrophils: 60 %
PLATELETS: 278 10*3/uL (ref 150–379)
RBC: 4.6 x10E6/uL (ref 3.77–5.28)
RDW: 13.5 % (ref 12.3–15.4)
WBC: 5.1 10*3/uL (ref 3.4–10.8)

## 2015-11-15 LAB — T4 AND TSH
T4 TOTAL: 7 ug/dL (ref 4.5–12.0)
TSH: 2.2 u[IU]/mL (ref 0.450–4.500)

## 2015-11-15 LAB — VITAMIN D 25 HYDROXY (VIT D DEFICIENCY, FRACTURES): VIT D 25 HYDROXY: 33.6 ng/mL (ref 30.0–100.0)

## 2015-11-17 ENCOUNTER — Telehealth: Payer: Self-pay

## 2015-11-17 DIAGNOSIS — E039 Hypothyroidism, unspecified: Secondary | ICD-10-CM

## 2015-11-17 DIAGNOSIS — I10 Essential (primary) hypertension: Secondary | ICD-10-CM

## 2015-11-17 LAB — PAP IG AND HPV HIGH-RISK
HPV, high-risk: NEGATIVE
PAP SMEAR COMMENT: 0

## 2015-11-17 MED ORDER — LEVOTHYROXINE SODIUM 125 MCG PO TABS: 125.0000 ug | ORAL_TABLET | Freq: Every day | ORAL | Status: DC

## 2015-11-17 MED ORDER — LISINOPRIL 5 MG PO TABS: 5.0000 mg | ORAL_TABLET | Freq: Every day | ORAL | Status: DC

## 2015-11-17 NOTE — Telephone Encounter (Signed)
-----   Message from Margarita Rana, MD sent at 11/15/2015  9:59 AM EDT ----- Labs stable. Except for elevated cholesterol. Really quite high at 271 and LDL at 171.  10 year risk of hear disease is less than 5 percent.  Please see if patient would like to start a medication if has strong family history of heart disease  Or would like to start a medication.  If would like to do lifestyle, Recheck cholesterol in 6 months. Also, please clarify her current medications and send to pharmacy of choice.  Thanks.

## 2015-11-17 NOTE — Telephone Encounter (Signed)
LMTCB 11/17/2015  Thanks,   -Mickel Baas

## 2015-11-17 NOTE — Telephone Encounter (Signed)
Pt advised.  She is going to work on lifestyle changes first.  Also sent RXs' to [facility].  Thanks,   -Mickel Baas

## 2015-11-18 ENCOUNTER — Telehealth: Payer: Self-pay

## 2015-11-18 NOTE — Telephone Encounter (Signed)
Advised pt of lab results. Pt verbally acknowledges understanding. Katerina Zurn Drozdowski, CMA   

## 2015-11-18 NOTE — Telephone Encounter (Signed)
lmtcb Emily Drozdowski, CMA  

## 2015-11-18 NOTE — Telephone Encounter (Signed)
-----   Message from Margarita Rana, MD sent at 11/17/2015 11:50 AM EDT ----- Pap is normal. Please notify patient.

## 2015-12-08 ENCOUNTER — Other Ambulatory Visit: Payer: Self-pay | Admitting: Family Medicine

## 2015-12-08 ENCOUNTER — Other Ambulatory Visit: Payer: Self-pay | Admitting: Physician Assistant

## 2015-12-08 ENCOUNTER — Ambulatory Visit
Admission: RE | Admit: 2015-12-08 | Discharge: 2015-12-08 | Disposition: A | Discharge status: Home / Self Care | Payer: 59 | Source: Ambulatory Visit | Attending: Family Medicine | Admitting: Family Medicine

## 2015-12-08 DIAGNOSIS — Z1231 Encounter for screening mammogram for malignant neoplasm of breast: Secondary | ICD-10-CM

## 2015-12-08 DIAGNOSIS — R928 Other abnormal and inconclusive findings on diagnostic imaging of breast: Secondary | ICD-10-CM | POA: Diagnosis not present

## 2015-12-08 DIAGNOSIS — R921 Mammographic calcification found on diagnostic imaging of breast: Secondary | ICD-10-CM | POA: Insufficient documentation

## 2015-12-12 ENCOUNTER — Ambulatory Visit
Admission: RE | Admit: 2015-12-12 | Discharge: 2015-12-12 | Disposition: A | Discharge status: Home / Self Care | Payer: 59 | Source: Ambulatory Visit | Attending: Physician Assistant | Admitting: Physician Assistant

## 2015-12-12 ENCOUNTER — Other Ambulatory Visit: Payer: Self-pay | Admitting: Physician Assistant

## 2015-12-12 DIAGNOSIS — R928 Other abnormal and inconclusive findings on diagnostic imaging of breast: Secondary | ICD-10-CM | POA: Diagnosis not present

## 2015-12-12 DIAGNOSIS — R921 Mammographic calcification found on diagnostic imaging of breast: Secondary | ICD-10-CM | POA: Diagnosis not present

## 2015-12-16 ENCOUNTER — Other Ambulatory Visit: Payer: Self-pay | Admitting: Physician Assistant

## 2015-12-16 DIAGNOSIS — R928 Other abnormal and inconclusive findings on diagnostic imaging of breast: Secondary | ICD-10-CM

## 2015-12-17 ENCOUNTER — Ambulatory Visit
Admission: RE | Admit: 2015-12-17 | Discharge: 2015-12-17 | Disposition: A | Discharge status: Home / Self Care | Payer: 59 | Source: Ambulatory Visit | Attending: Physician Assistant | Admitting: Physician Assistant

## 2015-12-17 ENCOUNTER — Other Ambulatory Visit: Payer: Self-pay | Admitting: Physician Assistant

## 2015-12-17 DIAGNOSIS — R928 Other abnormal and inconclusive findings on diagnostic imaging of breast: Secondary | ICD-10-CM

## 2015-12-18 ENCOUNTER — Other Ambulatory Visit: Payer: Self-pay | Admitting: Physician Assistant

## 2015-12-18 DIAGNOSIS — N6021 Fibroadenosis of right breast: Secondary | ICD-10-CM

## 2015-12-18 DIAGNOSIS — D241 Benign neoplasm of right breast: Secondary | ICD-10-CM

## 2015-12-19 ENCOUNTER — Telehealth: Payer: Self-pay | Admitting: *Deleted

## 2015-12-19 NOTE — Telephone Encounter (Signed)
Patient called office requesting to speak to [facility] about referral to General Surgery. Patient stated that she has some questions. Please advise?

## 2015-12-19 NOTE — Telephone Encounter (Signed)
Pt called to make sure that Vanessa Austin was in today b/c she hasn't heard back today. I advised that Vanessa Austin has had a full schedule today. Please advise. Thanks TNP

## 2015-12-19 NOTE — Telephone Encounter (Signed)
Called and spoke with patient. All answers were sought and answered. She has appt with Dr. Rochel Brome on 01/07/16

## 2015-12-29 ENCOUNTER — Telehealth: Payer: Self-pay | Admitting: Physician Assistant

## 2015-12-29 NOTE — Telephone Encounter (Signed)
Spoke with patient and reports that where she had the needle biopsy on her breast that she has notice a lump. It doesn't hurt, or feels hot to touch. She does reports that the lump is getting smaller.She just wants to know if there's anything she needs to worry about. She has a follow-up appointment with surgeon next week. Please advised.  Thanks,  -Jeanenne Licea

## 2015-12-29 NOTE — Telephone Encounter (Signed)
Patient advised as directed below.  Thanks,  -Remiel Corti 

## 2015-12-29 NOTE — Telephone Encounter (Signed)
Pt would like to speak to you about a lump where she had the needle biopsy on her breast.  She is suppose to see Dr. Tollie Pizza the surgeon but has not yet.  She has an appt next week.with him.  Her call back is [phone]  Thanks,. teri

## 2015-12-29 NOTE — Telephone Encounter (Signed)
Just have her watch for increasing redness, drainage, tenderness, warm to touch. She may apply moist heat to the area to help the body reabsorb. May be scar tissue developing from the biopsy.

## 2016-01-07 ENCOUNTER — Other Ambulatory Visit: Payer: Self-pay | Admitting: Surgery

## 2016-01-07 DIAGNOSIS — N63 Unspecified lump in unspecified breast: Secondary | ICD-10-CM

## 2016-01-08 ENCOUNTER — Other Ambulatory Visit: Payer: Self-pay | Admitting: *Deleted

## 2016-01-08 ENCOUNTER — Inpatient Hospital Stay
Admission: RE | Admit: 2016-01-08 | Discharge: 2016-01-08 | Disposition: A | Discharge status: Home / Self Care | Payer: Self-pay | Source: Ambulatory Visit | Attending: *Deleted | Admitting: *Deleted

## 2016-01-08 DIAGNOSIS — Z9289 Personal history of other medical treatment: Secondary | ICD-10-CM

## 2016-01-20 ENCOUNTER — Encounter
Admission: RE | Admit: 2016-01-20 | Discharge: 2016-01-20 | Disposition: A | Discharge status: Home / Self Care | Payer: 59 | Source: Ambulatory Visit | Attending: Surgery | Admitting: Surgery

## 2016-01-20 HISTORY — DX: Hypothyroidism, unspecified: E03.9

## 2016-01-20 NOTE — Patient Instructions (Signed)
  Your procedure is scheduled on: 01/27/16 Report to Mammography.  0820 AM   Remember: Instructions that are not followed completely may result in serious medical risk, up to and including death, or upon the discretion of your surgeon and anesthesiologist your surgery may need to be rescheduled.    __X__ 1. Do not eat food or drink liquids after midnight. No gum chewing or hard candies.     _X___ 2. No Alcohol for 24 hours before or after surgery.   ____ 3. Do Not Smoke For 24 Hours Prior to Your Surgery.   ____ 4. Bring all medications with you on the day of surgery if instructed.    __X__ 5. Notify your doctor if there is any change in your medical condition     (cold, fever, infections).       Do not wear jewelry, make-up, hairpins, clips or nail polish.  Do not wear lotions, powders, or perfumes. You may wear deodorant.  Do not shave 48 hours prior to surgery. Men may shave face and neck.  Do not bring valuables to the hospital.    [facility] is not responsible for any belongings or valuables.               Contacts, dentures or bridgework may not be worn into surgery.  Leave your suitcase in the car. After surgery it may be brought to your room.  For patients admitted to the hospital, discharge time is determined by your                treatment team.   Patients discharged the day of surgery will not be allowed to drive home.   Please read over the following fact sheets that you were given:   Surgical Site Infection Prevention   ___X_ Take these medicines the morning of surgery with A SIP OF WATER:    1. LEVOTHYROXINE  2. LISINOPRIL  3.   4.  5.  6.  ____ Fleet Enema (as directed)   __X__ Use CHG Soap as directed  ____ Use inhalers on the day of surgery  ____ Stop metformin 2 days prior to surgery    ____ Take 1/2 of usual insulin dose the night before surgery and none on the morning of surgery.   ____ Stop Coumadin/Plavix/aspirin on   ____ Stop  Anti-inflammatories on    ____ Stop supplements until after surgery.    ____ Bring C-Pap to the hospital.

## 2016-01-27 ENCOUNTER — Ambulatory Visit: Payer: 59 | Admitting: Anesthesiology

## 2016-01-27 ENCOUNTER — Ambulatory Visit
Admission: RE | Admit: 2016-01-27 | Discharge: 2016-01-27 | Disposition: A | Discharge status: Home / Self Care | Payer: 59 | Source: Ambulatory Visit | Attending: Surgery | Admitting: Surgery

## 2016-01-27 ENCOUNTER — Encounter
Admission: RE | Disposition: A | Discharge status: Home / Self Care | Payer: Self-pay | Source: Ambulatory Visit | Attending: Surgery

## 2016-01-27 ENCOUNTER — Encounter: Payer: Self-pay | Admitting: *Deleted

## 2016-01-27 ENCOUNTER — Other Ambulatory Visit: Payer: Self-pay | Admitting: Surgery

## 2016-01-27 DIAGNOSIS — D241 Benign neoplasm of right breast: Secondary | ICD-10-CM | POA: Insufficient documentation

## 2016-01-27 DIAGNOSIS — Z808 Family history of malignant neoplasm of other organs or systems: Secondary | ICD-10-CM | POA: Diagnosis not present

## 2016-01-27 DIAGNOSIS — E0590 Thyrotoxicosis, unspecified without thyrotoxic crisis or storm: Secondary | ICD-10-CM | POA: Insufficient documentation

## 2016-01-27 DIAGNOSIS — Z87891 Personal history of nicotine dependence: Secondary | ICD-10-CM | POA: Diagnosis not present

## 2016-01-27 DIAGNOSIS — N63 Unspecified lump in unspecified breast: Secondary | ICD-10-CM

## 2016-01-27 DIAGNOSIS — E039 Hypothyroidism, unspecified: Secondary | ICD-10-CM | POA: Diagnosis not present

## 2016-01-27 DIAGNOSIS — Z79899 Other long term (current) drug therapy: Secondary | ICD-10-CM | POA: Insufficient documentation

## 2016-01-27 DIAGNOSIS — Z880 Allergy status to penicillin: Secondary | ICD-10-CM | POA: Insufficient documentation

## 2016-01-27 DIAGNOSIS — Z882 Allergy status to sulfonamides status: Secondary | ICD-10-CM | POA: Diagnosis not present

## 2016-01-27 DIAGNOSIS — Z888 Allergy status to other drugs, medicaments and biological substances status: Secondary | ICD-10-CM | POA: Insufficient documentation

## 2016-01-27 DIAGNOSIS — Z8249 Family history of ischemic heart disease and other diseases of the circulatory system: Secondary | ICD-10-CM | POA: Insufficient documentation

## 2016-01-27 DIAGNOSIS — I10 Essential (primary) hypertension: Secondary | ICD-10-CM | POA: Insufficient documentation

## 2016-01-27 HISTORY — PX: BREAST EXCISIONAL BIOPSY: SUR124

## 2016-01-27 HISTORY — PX: BREAST LUMPECTOMY WITH NEEDLE LOCALIZATION: SHX5759

## 2016-01-27 SURGERY — BREAST LUMPECTOMY WITH NEEDLE LOCALIZATION
Anesthesia: General | Laterality: Right | Wound class: Clean

## 2016-01-27 MED ORDER — ONDANSETRON HCL 4 MG/2ML IJ SOLN
INTRAMUSCULAR | Status: DC | PRN
  Administered 2016-01-27: 4 mg via INTRAVENOUS

## 2016-01-27 MED ORDER — LIDOCAINE HCL (CARDIAC) 20 MG/ML IV SOLN
INTRAVENOUS | Status: DC | PRN
  Administered 2016-01-27: 50 mg via INTRAVENOUS

## 2016-01-27 MED ORDER — MIDAZOLAM HCL 2 MG/2ML IJ SOLN
INTRAMUSCULAR | Status: DC | PRN
  Administered 2016-01-27: 2 mg via INTRAVENOUS

## 2016-01-27 MED ORDER — LACTATED RINGERS IV SOLN
INTRAVENOUS | Status: DC
  Administered 2016-01-27: 09:00:00 via INTRAVENOUS

## 2016-01-27 MED ORDER — EPHEDRINE SULFATE 50 MG/ML IJ SOLN
INTRAMUSCULAR | Status: DC | PRN
  Administered 2016-01-27 (×2): 10 mg via INTRAVENOUS

## 2016-01-27 MED ORDER — FENTANYL CITRATE (PF) 100 MCG/2ML IJ SOLN
INTRAMUSCULAR | Status: DC | PRN
  Administered 2016-01-27 (×2): 50 ug via INTRAVENOUS

## 2016-01-27 MED ORDER — BUPIVACAINE-EPINEPHRINE (PF) 0.5% -1:200000 IJ SOLN
INTRAMUSCULAR | Status: AC
  Filled 2016-01-27: qty 30

## 2016-01-27 MED ORDER — DEXAMETHASONE SODIUM PHOSPHATE 10 MG/ML IJ SOLN
INTRAMUSCULAR | Status: DC | PRN
  Administered 2016-01-27: 10 mg via INTRAVENOUS

## 2016-01-27 MED ORDER — PROPOFOL 10 MG/ML IV BOLUS
INTRAVENOUS | Status: DC | PRN
  Administered 2016-01-27: 200 mg via INTRAVENOUS

## 2016-01-27 MED ORDER — FENTANYL CITRATE (PF) 100 MCG/2ML IJ SOLN
INTRAMUSCULAR | Status: AC
  Filled 2016-01-27: qty 2

## 2016-01-27 MED ORDER — HYDROCODONE-ACETAMINOPHEN 5-325 MG PO TABS: 1.0000 | ORAL_TABLET | ORAL | 0 refills | Status: DC | PRN

## 2016-01-27 MED ORDER — ONDANSETRON HCL 4 MG/2ML IJ SOLN: 4.0000 mg | Freq: Once | INTRAMUSCULAR | Status: DC | PRN

## 2016-01-27 MED ORDER — FAMOTIDINE 20 MG PO TABS
ORAL_TABLET | ORAL | Status: AC
  Administered 2016-01-27: 20 mg via ORAL
  Filled 2016-01-27: qty 1

## 2016-01-27 MED ORDER — HYDROCODONE-ACETAMINOPHEN 5-325 MG PO TABS
1.0000 | ORAL_TABLET | ORAL | Status: DC | PRN
  Administered 2016-01-27: 1 via ORAL

## 2016-01-27 MED ORDER — BUPIVACAINE-EPINEPHRINE 0.5% -1:200000 IJ SOLN
INTRAMUSCULAR | Status: DC | PRN
  Administered 2016-01-27: 10 mL

## 2016-01-27 MED ORDER — HYDROCODONE-ACETAMINOPHEN 5-325 MG PO TABS
ORAL_TABLET | ORAL | Status: AC
  Filled 2016-01-27: qty 1

## 2016-01-27 MED ORDER — FAMOTIDINE 20 MG PO TABS
20.0000 mg | ORAL_TABLET | Freq: Once | ORAL | Status: AC
  Administered 2016-01-27: 20 mg via ORAL

## 2016-01-27 MED ORDER — FENTANYL CITRATE (PF) 100 MCG/2ML IJ SOLN
25.0000 ug | INTRAMUSCULAR | Status: DC | PRN
  Administered 2016-01-27 (×4): 25 ug via INTRAVENOUS

## 2016-01-27 SURGICAL SUPPLY — 25 items
BLADE SURG 15 STRL LF DISP TIS (BLADE) ×1 IMPLANT
BLADE SURG 15 STRL SS (BLADE) ×2
CANISTER SUCT 1200ML W/VALVE (MISCELLANEOUS) ×3 IMPLANT
CHLORAPREP W/TINT 26ML (MISCELLANEOUS) ×3 IMPLANT
DEVICE DUBIN SPECIMEN MAMMOGRA (MISCELLANEOUS) ×3 IMPLANT
DRAPE LAPAROTOMY 77X122 PED (DRAPES) ×3 IMPLANT
ELECT REM PT RETURN 9FT ADLT (ELECTROSURGICAL) ×3
ELECTRODE REM PT RTRN 9FT ADLT (ELECTROSURGICAL) ×1 IMPLANT
GLOVE BIO SURGEON STRL SZ7.5 (GLOVE) ×18 IMPLANT
GOWN STRL REUS W/ TWL LRG LVL3 (GOWN DISPOSABLE) ×2 IMPLANT
GOWN STRL REUS W/TWL LRG LVL3 (GOWN DISPOSABLE) ×4
KIT RM TURNOVER STRD PROC AR (KITS) ×3 IMPLANT
LABEL OR SOLS (LABEL) ×3 IMPLANT
LIQUID BAND (GAUZE/BANDAGES/DRESSINGS) ×3 IMPLANT
MARGIN MAP 10MM (MISCELLANEOUS) ×3 IMPLANT
NEEDLE HYPO 25X1 1.5 SAFETY (NEEDLE) ×3 IMPLANT
PACK BASIN MINOR ARMC (MISCELLANEOUS) ×3 IMPLANT
SUT CHROMIC 4 0 RB 1X27 (SUTURE) ×3 IMPLANT
SUT ETHILON 3-0 FS-10 30 BLK (SUTURE) ×3
SUT MNCRL 4-0 (SUTURE) ×2
SUT MNCRL 4-0 27XMFL (SUTURE) ×1
SUTURE EHLN 3-0 FS-10 30 BLK (SUTURE) ×1 IMPLANT
SUTURE MNCRL 4-0 27XMF (SUTURE) ×1 IMPLANT
SYRINGE 10CC LL (SYRINGE) ×3 IMPLANT
WATER STERILE IRR 1000ML POUR (IV SOLUTION) ×3 IMPLANT

## 2016-01-27 NOTE — Consult Note (Signed)
  She comes in today for excision of 2 right breast masses. She has had preoperative x-ray needle localization with insertion of 2 wires. I have reviewed the mammogram images. 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  The right side was marked YES  Lab work reviewed  I discussed the plan for excision of the 2 right breast masses. Also discussed perioperative care.

## 2016-01-27 NOTE — Discharge Instructions (Addendum)
Take Tylenol or Norco if needed for pain.  Should not drive or do anything dangerous when taking Norco.  May shower.  Wear bra as desired for comfort and support.  Lumpectomy, Care After Refer to this sheet in the next few weeks. These instructions provide you with information on caring for yourself after your procedure. Your health care provider may also give you more specific instructions. Your treatment has been planned according to current medical practices, but problems sometimes occur. Call your health care provider if you have any problems or questions after your procedure. WHAT TO EXPECT AFTER THE PROCEDURE After your procedure, it is typical to have soreness, bruising, and swelling of your breast. This is normal. You will be given medicines to control your pain. HOME CARE INSTRUCTIONS  Take medicines only as directed by your health care provider.  Resume a normal diet as directed by your health care provider.  Resume normal activity as directed by your health care provider. Avoid strenuous activity that affects the arm on the side that the surgical cut (incision) was made. Avoid playing tennis, swimming, lifting heavy objects (those that weigh more than 10 pounds [4.5 kg]), and pulling for 2 weeks.  Change bandages (dressings) as directed by your health care provider.  Consider wearing a bra to bed if you feel discomfort at the breast. Wearing a bra also helps keep dressings on.  Keep all follow-up visits as directed by your health care provider. This is important.  Call for the results of your procedure as instructed by your surgeon. It is your responsibility to get your test results. Do not assume everything is fine if you have not heard from your health care provider.  Keep the incision site dry.  If the incision site is tender, applying an ice pack may relieve some discomfort. To do this:  Put ice in a plastic bag.  Place a towel between your skin and the bag.  Leave  the ice on for 15-20 minutes, 3-4 times a day. SEEK MEDICAL CARE IF:   You have increased bleeding from the incision site.  You notice redness, swelling, or increasing pain in the incision.  You have pus coming from the incision site.  You have a fever.  You notice a foul smell coming from the incision or dressing. SEEK IMMEDIATE MEDICAL CARE IF:   You develop a rash.  You have shortness of breath.  You have chest pain.   This information is not intended to replace advice given to you by your health care provider. Make sure you discuss any questions you have with your health care provider.   Document Released: 05/26/2006 Document Revised: 05/31/2014 Document Reviewed: 12/07/2012 Elsevier Interactive Patient Education 2016 [place] Anesthesia, Adult, Care After Refer to this sheet in the next few weeks. These instructions provide you with information on caring for yourself after your procedure. Your health care provider may also give you more specific instructions. Your treatment has been planned according to current medical practices, but problems sometimes occur. Call your health care provider if you have any problems or questions after your procedure. WHAT TO EXPECT AFTER THE PROCEDURE After the procedure, it is typical to experience:  Sleepiness.  Nausea and vomiting. HOME CARE INSTRUCTIONS  For the first 24 hours after general anesthesia:  Have a responsible person with you.  Do not drive a car. If you are alone, do not take public transportation.  Do not drink alcohol.  Do not take medicine that  has not been prescribed by your health care provider.  Do not sign important papers or make important decisions.  You may resume a normal diet and activities as directed by your health care provider.  Change bandages (dressings) as directed.  If you have questions or problems that seem related to general anesthesia, call the hospital and ask for the  anesthetist or anesthesiologist on call. SEEK MEDICAL CARE IF:  You have nausea and vomiting that continue the day after anesthesia.  You develop a rash. SEEK IMMEDIATE MEDICAL CARE IF:   You have difficulty breathing.  You have chest pain.  You have any allergic problems.   This information is not intended to replace advice given to you by your health care provider. Make sure you discuss any questions you have with your health care provider.   Document Released: 08/16/2000 Document Revised: 05/31/2014 Document Reviewed: 09/08/2011 Elsevier Interactive Patient Education [employer].

## 2016-01-27 NOTE — Addendum Note (Signed)
Addendum  created 01/27/16 1340 by Darlyne Russian, CRNA   Anesthesia Event deleted, Anesthesia Event edited

## 2016-01-27 NOTE — Anesthesia Preprocedure Evaluation (Signed)
Anesthesia Evaluation  Patient identified by MRN, date of birth, ID band Patient awake    Reviewed: Allergy & Precautions, NPO status , Patient's Chart, lab work & pertinent test results  Airway Mallampati: III  TM Distance: <3 FB     Dental no notable dental hx. (+) Caps   Pulmonary former smoker,    Pulmonary exam normal        Cardiovascular hypertension, Pt. on medications Normal cardiovascular exam     Neuro/Psych Carpal tunnel syndrome  Neuromuscular disease negative psych ROS   GI/Hepatic negative GI ROS, Neg liver ROS,   Endo/Other  Hypothyroidism Hyperthyroidism   Renal/GU negative Renal ROS  negative genitourinary   Musculoskeletal negative musculoskeletal ROS (+)   Abdominal Normal abdominal exam  (+)   Peds negative pediatric ROS (+)  Hematology negative hematology ROS (+)   Anesthesia Other Findings   Reproductive/Obstetrics negative OB ROS                             Anesthesia Physical Anesthesia Plan  ASA: II  Anesthesia Plan: General   Post-op Pain Management:    Induction: Intravenous  Airway Management Planned: LMA  Additional Equipment:   Intra-op Plan:   Post-operative Plan: Extubation in OR  Informed Consent: I have reviewed the patients History and Physical, chart, labs and discussed the procedure including the risks, benefits and alternatives for the proposed anesthesia with the patient or authorized representative who has indicated his/her understanding and acceptance.   Dental advisory given  Plan Discussed with: CRNA and Surgeon  Anesthesia Plan Comments:         Anesthesia Quick Evaluation

## 2016-01-27 NOTE — Transfer of Care (Signed)
Immediate Anesthesia Transfer of Care Note  Patient: Vanessa Austin  Procedure(s) Performed: Procedure(s): BREAST LUMPECTOMY WITH NEEDLE LOCALIZATION (Right)  Patient Location: PACU  Anesthesia Type:General  Level of Consciousness: awake  Airway & Oxygen Therapy: Patient Spontanous Breathing  Post-op Assessment: Report given to RN  Post vital signs: stable  Last Vitals:  Vitals:   01/27/16 0957 01/27/16 1145  BP: (!) 165/92 102/73  Pulse:  (!) 107  Resp:  16  Temp:  36.3 C    Last Pain:  Vitals:   01/27/16 0900  TempSrc: Oral         Complications: No apparent anesthesia complications

## 2016-01-27 NOTE — Anesthesia Procedure Notes (Signed)
Procedure Name: LMA Insertion Date/Time: 01/27/2016 10:32 AM Performed by: Darlyne Russian Pre-anesthesia Checklist: Patient identified, Emergency Drugs available, Suction available and Patient being monitored Patient Re-evaluated:Patient Re-evaluated prior to inductionOxygen Delivery Method: Circle system utilized Preoxygenation: Pre-oxygenation with 100% oxygen Intubation Type: IV induction Ventilation: Mask ventilation without difficulty LMA: LMA inserted LMA Size: 3.5 Number of attempts: 1 Dental Injury: Teeth and Oropharynx as per pre-operative assessment

## 2016-01-27 NOTE — Anesthesia Postprocedure Evaluation (Signed)
Anesthesia Post Note  Patient: Vanessa Austin  Procedure(s) Performed: Procedure(s) (LRB): BREAST LUMPECTOMY WITH NEEDLE LOCALIZATION (Right)  Patient location during evaluation: PACU Anesthesia Type: General Level of consciousness: awake and alert and oriented Pain management: pain level controlled Vital Signs Assessment: post-procedure vital signs reviewed and stable Respiratory status: spontaneous breathing Cardiovascular status: blood pressure returned to baseline Anesthetic complications: no    Last Vitals:  Vitals:   01/27/16 1225 01/27/16 1233  BP: 118/86 130/77  Pulse:  77  Resp:  16  Temp:  36.1 C    Last Pain:  Vitals:   01/27/16 1233  TempSrc: Tympanic  PainSc: 4                  Chrisopher Pustejovsky

## 2016-01-27 NOTE — Op Note (Signed)
OPERATIVE REPORT  PREOPERATIVE  DIAGNOSIS: . Two right breast masses  POSTOPERATIVE DIAGNOSIS: .Two right breast masses  PROCEDURE: . Excision two right breast masses  ANESTHESIA:  General  SURGEON: Rochel Brome  MD   INDICATIONS: . She had recent mammograms depicting densities in the lower outer quadrant of the right breast. Stereotactic needle biopsy demonstrated complex sclerosing lesion and calcifications in one and intraductal papilloma and the other. Both sites were adjacent to one another. She had preoperative x-ray needle localization. Excision was recommended for further evaluation and treatment.  With the patient on the operating table in the supine position under general anesthesia the right arm was placed on a lateral arm support. The dressing was removed from the right breast exposing the 2 Kopan's wires which were cut 2 cm from the skin. The wires entered the lower outer quadrant of the right breast. Mammograms were reviewed prior to incision.  The right breast was prepared with ChloraPrep and draped in a sterile manner. A curvilinear incision was made in the lower outer quadrant 5.5 cm from the nipple and carried down through subcutaneous tissues to encounter the wires. A mass of tissue surrounding the end of both wires was excised which was approximately 3 x 3 x 4 cm in dimension. The specimen was marked with margin maps to mark the cranial caudal medial lateral and deep margins. The specimen was submitted for specimen mammogram and routine pathology. The wound was inspected and several small bleeding points were cauterized. Subcutaneous tissues were infiltrated with half percent Sensorcaine with epinephrine. Tissues surrounding cautery artifact were also infiltrated. The wound was closed with a running 4-0 Monocryl subcuticular suture and LiquiBand. The patient tolerated surgery satisfactorily and was prepared for transfer to the recovery room  [facility].D.

## 2016-01-29 ENCOUNTER — Encounter: Payer: Self-pay | Admitting: Surgery

## 2016-01-29 LAB — SURGICAL PATHOLOGY

## 2016-03-01 ENCOUNTER — Inpatient Hospital Stay: Payer: 59 | Attending: Internal Medicine | Admitting: Internal Medicine

## 2016-03-01 ENCOUNTER — Encounter: Payer: Self-pay | Admitting: Internal Medicine

## 2016-03-01 DIAGNOSIS — I10 Essential (primary) hypertension: Secondary | ICD-10-CM | POA: Diagnosis not present

## 2016-03-01 DIAGNOSIS — Z79899 Other long term (current) drug therapy: Secondary | ICD-10-CM | POA: Insufficient documentation

## 2016-03-01 DIAGNOSIS — E039 Hypothyroidism, unspecified: Secondary | ICD-10-CM | POA: Insufficient documentation

## 2016-03-01 DIAGNOSIS — Z87891 Personal history of nicotine dependence: Secondary | ICD-10-CM | POA: Diagnosis not present

## 2016-03-01 DIAGNOSIS — N6091 Unspecified benign mammary dysplasia of right breast: Secondary | ICD-10-CM | POA: Insufficient documentation

## 2016-03-01 DIAGNOSIS — Z808 Family history of malignant neoplasm of other organs or systems: Secondary | ICD-10-CM | POA: Insufficient documentation

## 2016-03-01 NOTE — Progress Notes (Signed)
[place] NOTE  Patient Care Team: Mar Daring, PA-C as PCP - General (Family Medicine)  CHIEF COMPLAINTS/PURPOSE OF CONSULTATION:    #  RIGHT BREAST ATYPICAL DUCTAL HYPERPLASIA [s/p Lumpectomy; dr.Smith]; NO TAMOXIFEN   No history exists.   HISTORY OF PRESENTING ILLNESS:  Vanessa Austin 60 y.o.  female with no significant past medical history noted to have an abnormal mammogram that led to biopsy; and finally lumpectomy with Dr. Tamala Julian- that showed atypical ductal hyperplasia on the right breast.  Patient denies any other lumps or bumps. She does not have any significant family history of breast cancer ovarian cancer. No previous biopsies.  No history of a stroke and DVT. She is postmenopausal. No history of any vaginal bleeding.   ROS: A complete 10 point review of system is done which is negative except mentioned above in history of present illness  MEDICAL HISTORY:  Past Medical History:  Diagnosis Date  . Allergy   . Hypertension   . Hypothyroidism   . Thyroid disease     SURGICAL HISTORY: Past Surgical History:  Procedure Laterality Date  . BILATERAL CARPAL TUNNEL RELEASE Bilateral 10/01/2010   Dr. Leanor Kail  . BREAST EXCISIONAL BIOPSY Right 01/27/2016   papilloma and complex sclerosing lesion removed  . BREAST LUMPECTOMY WITH NEEDLE LOCALIZATION Right 01/27/2016   Procedure: BREAST LUMPECTOMY WITH NEEDLE LOCALIZATION;  Surgeon: Leonie Green, MD;  Location: [facility] ORS;  Service: General;  Laterality: Right;    SOCIAL HISTORY: work at [employer]. [place]. occasional alochol; remote history of smoking.  Social History   Social History  . Marital status: Married    Spouse name: Nadara Mustard  . Number of children: 3  . Years of education: College   Occupational History  . [employer]   Social History Main Topics  . Smoking status: Former Smoker    Packs/day: 0.25    Years: 5.00    Quit date: 05/23/1980  .  Smokeless tobacco: Never Used  . Alcohol use 4.2 oz/week    7 Glasses of wine per week  . Drug use: No  . Sexual activity: Yes   Other Topics Concern  . Not on file   Social History Narrative  . No narrative on file    FAMILY HISTORY: GBM-mom 80s;  Family History  Problem Relation Age of Onset  . Hypertension Mother   . Brain cancer Mother   . Hypertension Father   . Heart disease Father   . Hypertension Brother     ALLERGIES:  is allergic to etodolac; penicillins; sulfa antibiotics; and meloxicam.  MEDICATIONS:  Current Outpatient Prescriptions  Medication Sig Dispense Refill  . Cetirizine HCl 10 MG CAPS Take 10 mg by mouth daily.    . Cholecalciferol 10000 UNITS CAPS Take by mouth daily.    . fluticasone (FLONASE) 50 MCG/ACT nasal spray Place 2 sprays into the nose daily.    Marland Kitchen levothyroxine (SYNTHROID, LEVOTHROID) 125 MCG tablet Take 1 tablet (125 mcg total) by mouth daily. 90 tablet 3  . lisinopril (PRINIVIL,ZESTRIL) 5 MG tablet Take 1 tablet (5 mg total) by mouth daily. 90 tablet 3   No current facility-administered medications for this visit.       Marland Kitchen  PHYSICAL EXAMINATION: ECOG PERFORMANCE STATUS: 0 - Asymptomatic  Vitals:   03/01/16 1136  BP: (!) 155/117  Pulse: 85  Resp: 18  Temp: (!) 95.9 F (35.5 C)   Filed Weights   03/01/16  1136  Weight: 199 lb 3.2 oz (90.4 kg)    GENERAL: Well-nourished well-developed; Alert, no distress and comfortable.   With her husband.  EYES: no pallor or icterus OROPHARYNX: no thrush or ulceration; good dentition  NECK: supple, no masses felt LYMPH:  no palpable lymphadenopathy in the cervical, axillary or inguinal regions LUNGS: clear to auscultation and  No wheeze or crackles HEART/CVS: regular rate & rhythm and no murmurs; No lower extremity edema ABDOMEN: abdomen soft, non-tender and normal bowel sounds Musculoskeletal:no cyanosis of digits and no clubbing  PSYCH: alert & oriented x 3 with fluent speech NEURO: no  focal motor/sensory deficits SKIN:  no rashes or significant lesions  LABORATORY DATA:  I have reviewed the data as listed Lab Results  Component Value Date   WBC 5.1 11/14/2015   HCT 41.7 11/14/2015   MCV 91 11/14/2015   PLT 278 11/14/2015    Recent Labs  11/14/15 0956  NA 141  K 4.7  CL 102  CO2 22  GLUCOSE 94  BUN 15  CREATININE 0.90  CALCIUM 9.8  GFRNONAA 71  GFRAA 82  PROT 7.1  ALBUMIN 4.6  AST 18  ALT 24  ALKPHOS 54  BILITOT 0.3    RADIOGRAPHIC STUDIES: I have personally reviewed the radiological images as listed and agreed with the findings in the report. No results found.  ASSESSMENT & PLAN:   Atypical ductal hyperplasia of right breast # Atypical ductal hyperplasia of the right breast status post lumpectomy. Clear margins. Discussed the slightly increased risk of breast cancer in patient with history of atypical ductal hyperplasia. Discussed in general the role of weight loss; exercise; eating healthy to cut down the risk of breast cancer. Also discussed the role of monthly self breast exams; and also mammograms on interval basis.  I also  Discussed the role of primary prevention with tamoxifen- in cutting down the risk of breast cancer in patients with a history of atypical ductal hyperplasia. Also discussed the potential side effects including but not limited to- hot flashes and weight gain; DVT PE; and also small risk of endometrial hyperplasia. In general patient would be at a low risk of significant side effects.  # After the lengthy discussion patient decided to hold off tamoxifen this time.  # She follow-up with me in one year; no labs. And then no concerns follow up with PCP/surgeon.   Thank you Dr. Tamala Julian for allowing me to participate in the care of your pleasant patient. Please do not hesitate to contact me with questions or concerns in the interim.  All questions were answered. The patient knows to call the clinic with any problems, questions or  concerns.  # 45 minutes face-to-face with the patient discussing the above plan of care; more than 50% of time spent on prognosis/ natural history; counseling and coordination.     Cammie Sickle, MD 03/01/2016 1:33 PM

## 2016-03-01 NOTE — Progress Notes (Signed)
Pt BP elevated today no s/s of elevated BP. PT will follow up with PCP. Mammo was in July.  Lumpectomy was 01/27/16.

## 2016-03-01 NOTE — Assessment & Plan Note (Signed)
#   Atypical ductal hyperplasia of the right breast status post lumpectomy. Clear margins. Discussed the slightly increased risk of breast cancer in patient with history of atypical ductal hyperplasia. Discussed in general the role of weight loss; exercise; eating healthy to cut down the risk of breast cancer. Also discussed the role of monthly self breast exams; and also mammograms on interval basis.  I also  Discussed the role of primary prevention with tamoxifen- in cutting down the risk of breast cancer in patients with a history of atypical ductal hyperplasia. Also discussed the potential side effects including but not limited to- hot flashes and weight gain; DVT PE; and also small risk of endometrial hyperplasia. In general patient would be at a low risk of significant side effects.  # After the lengthy discussion patient decided to hold off tamoxifen this time.  # She follow-up with me in one year; no labs. And then no concerns follow up with PCP/surgeon.   Thank you Dr. Tamala Julian for allowing me to participate in the care of your pleasant patient. Please do not hesitate to contact me with questions or concerns in the interim.

## 2016-04-19 ENCOUNTER — Encounter: Payer: Self-pay | Admitting: Physician Assistant

## 2016-04-19 ENCOUNTER — Ambulatory Visit (INDEPENDENT_AMBULATORY_CARE_PROVIDER_SITE_OTHER): Payer: 59 | Admitting: Physician Assistant

## 2016-04-19 VITALS — BP 150/100 | HR 76 | Temp 97.8°F | Resp 16 | Ht 63.0 in | Wt 202.4 lb

## 2016-04-19 DIAGNOSIS — E6609 Other obesity due to excess calories: Secondary | ICD-10-CM | POA: Diagnosis not present

## 2016-04-19 DIAGNOSIS — Z23 Encounter for immunization: Secondary | ICD-10-CM | POA: Diagnosis not present

## 2016-04-19 DIAGNOSIS — Z6835 Body mass index (BMI) 35.0-35.9, adult: Secondary | ICD-10-CM | POA: Diagnosis not present

## 2016-04-19 DIAGNOSIS — I10 Essential (primary) hypertension: Secondary | ICD-10-CM | POA: Diagnosis not present

## 2016-04-19 NOTE — Progress Notes (Signed)
Patient: Vanessa Austin Female    DOB: 08-Jul-1955   60 y.o.   MRN: [id number] Visit Date: 04/19/2016  Today's Provider: Mar Daring, PA-C   Chief Complaint  Patient presents with  . Obesity  . Follow-up    BP   Subjective:    HPI Hypertension: Patient here for follow-up of elevated blood pressure. She is not exercising and is adherent to low salt diet.  Blood pressure is well controlled at home. Cardiac symptoms none. Patient denies chest pain, chest pressure/discomfort, exertional chest pressure/discomfort, fatigue, irregular heart beat, lower extremity edema, near-syncope and palpitations.  Cardiovascular risk factors: dyslipidemia, hypertension and obesity (BMI >= 30 kg/m2).  Obesity: Patient complains of obesity. Patient cites health, increased physical ability, self-image as reasons for wanting to lose weight.  History of Weight Loss Efforts Greatest amount of weight lost: 20 lb over  Few years. Amount of time that loss was maintained: 10 years Circumstances associated with regain of weight: stressed. Mother passed away in Aug 29, 2012 from [place]. Husband is a colon cancer survivor and her father passed away this year in Dec 30, 2022. Successful weight loss techniques attempted: self-directed dieting and and exercise  Current Exercise Habits none  Current Eating Habits Number of regular meals per day: 3 Number of snacking episodes per day: 0 Who shops for food? patient and husband Who prepares food? patient Who eats with patient? patient and husband Eating precipitated by stress? yes - sometimes      Allergies  Allergen Reactions  . Etodolac   . Penicillins   . Sulfa Antibiotics   . Meloxicam Rash     Current Outpatient Prescriptions:  .  Cetirizine HCl 10 MG CAPS, Take 10 mg by mouth daily., Disp: , Rfl:  .  Cholecalciferol 10000 UNITS CAPS, Take by mouth daily., Disp: , Rfl:  .  fluticasone (FLONASE) 50 MCG/ACT nasal spray, Place 2 sprays into the nose  daily., Disp: , Rfl:  .  levothyroxine (SYNTHROID, LEVOTHROID) 125 MCG tablet, Take 1 tablet (125 mcg total) by mouth daily., Disp: 90 tablet, Rfl: 3 .  lisinopril (PRINIVIL,ZESTRIL) 5 MG tablet, Take 1 tablet (5 mg total) by mouth daily., Disp: 90 tablet, Rfl: 3  Review of Systems  Constitutional: Negative.   Respiratory: Negative.   Cardiovascular: Negative.   Gastrointestinal: Negative.   Neurological: Negative.   Psychiatric/Behavioral: Negative.     Social History  Substance Use Topics  . Smoking status: Former Smoker    Packs/day: 0.25    Years: 5.00    Quit date: 05/23/1980  . Smokeless tobacco: Never Used  . Alcohol use 4.2 oz/week    7 Glasses of wine per week   Objective:   BP (!) 150/100 (BP Location: Right Arm, Patient Position: Sitting, Cuff Size: Normal)   Pulse 76   Temp 97.8 F (36.6 C) (Oral)   Resp 16   Ht 5\' 3"  (1.6 m)   Wt 202 lb 6.4 oz (91.8 kg)   BMI 35.85 kg/m   Physical Exam  Constitutional: She appears well-developed and well-nourished. No distress.  Neck: Normal range of motion. Neck supple. No JVD present. No tracheal deviation present. No thyromegaly present.  Cardiovascular: Normal rate, regular rhythm and normal heart sounds.  Exam reveals no gallop and no friction rub.   No murmur heard. Pulmonary/Chest: Effort normal and breath sounds normal. No respiratory distress. She has no wheezes. She has no rales.  Musculoskeletal: She exhibits no edema.  Lymphadenopathy:  She has no cervical adenopathy.  Skin: She is not diaphoretic.  Psychiatric: She has a normal mood and affect. Her behavior is normal. Judgment and thought content normal.  Vitals reviewed.      Assessment & Plan:     1. Essential hypertension Stable. Continue lisinopril 5 mg daily.  2. Class 2 obesity due to excess calories without serious comorbidity with body mass index (BMI) of 35.0 to 35.9 in adult Discussed in detail weight loss options including calorie  restriction, healthier options and exercise. She is motivated and has lost weight on her own previously. She is interested in trying to do this again on her own just wanted to make sure that her blood pressure and other health issues were stable for her to start physical activity.  3. Need for influenza vaccination Flu vaccine given today without complication. Patient sat upright for 15 minutes to check for adverse reaction before being released. - Flu Vaccine QUAD 36+ mos IM       Mar Daring, PA-C  [facility]

## 2016-04-19 NOTE — Patient Instructions (Addendum)
Calorie Counting for Weight Loss Calories are energy you get from the things you eat and drink. Your body uses this energy to keep you going throughout the day. The number of calories you eat affects your weight. When you eat more calories than your body needs, your body stores the extra calories as fat. When you eat fewer calories than your body needs, your body burns fat to get the energy it needs. Calorie counting means keeping track of how many calories you eat and drink each day. If you make sure to eat fewer calories than your body needs, you should lose weight. In order for calorie counting to work, you will need to eat the number of calories that are right for you in a day to lose a healthy amount of weight per week. A healthy amount of weight to lose per week is usually 1-2 lb (0.5-0.9 kg). A dietitian can determine how many calories you need in a day and give you suggestions on how to reach your calorie goal.  WHAT IS MY MY PLAN? My goal is to have 1200 calories per day.  If I have this many calories per day, I should lose around 1-2 pounds per week. WHAT DO I NEED TO KNOW ABOUT CALORIE COUNTING? In order to meet your daily calorie goal, you will need to:  Find out how many calories are in each food you would like to eat. Try to do this before you eat.  Decide how much of the food you can eat.  Write down what you ate and how many calories it had. Doing this is called keeping a food log. WHERE DO I FIND CALORIE INFORMATION? The number of calories in a food can be found on a Nutrition Facts label. Note that all the information on a label is based on a specific serving of the food. If a food does not have a Nutrition Facts label, try to look up the calories online or ask your dietitian for help. HOW DO I DECIDE HOW MUCH TO EAT? To decide how much of the food you can eat, you will need to consider both the number of calories in one serving and the size of one serving. This information can be  found on the Nutrition Facts label. If a food does not have a Nutrition Facts label, look up the information online or ask your dietitian for help. Remember that calories are listed per serving. If you choose to have more than one serving of a food, you will have to multiply the calories per serving by the amount of servings you plan to eat. For example, the label on a package of bread might say that a serving size is 1 slice and that there are 90 calories in a serving. If you eat 1 slice, you will have eaten 90 calories. If you eat 2 slices, you will have eaten 180 calories. HOW DO I KEEP A FOOD LOG? After each meal, record the following information in your food log:  What you ate.  How much of it you ate.  How many calories it had.  Then, add up your calories. Keep your food log near you, such as in a small notebook in your pocket. Another option is to use a mobile app or website. Some programs will calculate calories for you and show you how many calories you have left each time you add an item to the log. WHAT ARE SOME CALORIE COUNTING TIPS?  Use your calories on foods   and drinks that will fill you up and not leave you hungry. Some examples of this include foods like nuts and nut butters, vegetables, lean proteins, and high-fiber foods (more than 5 g fiber per serving).  Eat nutritious foods and avoid empty calories. Empty calories are calories you get from foods or beverages that do not have many nutrients, such as candy and soda. It is better to have a nutritious high-calorie food (such as an avocado) than a food with few nutrients (such as a bag of chips).  Know how many calories are in the foods you eat most often. This way, you do not have to look up how many calories they have each time you eat them.  Look out for foods that may seem like low-calorie foods but are really high-calorie foods, such as baked goods, soda, and fat-free candy.  Pay attention to calories in drinks. Drinks  such as sodas, specialty coffee drinks, alcohol, and juices have a lot of calories yet do not fill you up. Choose low-calorie drinks like water and diet drinks.  Focus your calorie counting efforts on higher calorie items. Logging the calories in a garden salad that contains only vegetables is less important than calculating the calories in a milk shake.  Find a way of tracking calories that works for you. Get creative. Most people who are successful find ways to keep track of how much they eat in a day, even if they do not count every calorie. WHAT ARE SOME PORTION CONTROL TIPS?  Know how many calories are in a serving. This will help you know how many servings of a certain food you can have.  Use a measuring cup to measure serving sizes. This is helpful when you start out. With time, you will be able to estimate serving sizes for some foods.  Take some time to put servings of different foods on your favorite plates, bowls, and cups so you know what a serving looks like.  Try not to eat straight from a bag or box. Doing this can lead to overeating. Put the amount you would like to eat in a cup or on a plate to make sure you are eating the right portion.  Use smaller plates, glasses, and bowls to prevent overeating. This is a quick and easy way to practice portion control. If your plate is smaller, less food can fit on it.  Try not to multitask while eating, such as watching TV or using your computer. If it is time to eat, sit down at a table and enjoy your food. Doing this will help you to start recognizing when you are full. It will also make you more aware of what and how much you are eating. HOW CAN I CALORIE COUNT WHEN EATING OUT?  Ask for smaller portion sizes or child-sized portions.  Consider sharing an entree and sides instead of getting your own entree.  If you get your own entree, eat only half. Ask for a box at the beginning of your meal and put the rest of your entree in it so  you are not tempted to eat it.  Look for the calories on the menu. If calories are listed, choose the lower calorie options.  Choose dishes that include vegetables, fruits, whole grains, low-fat dairy products, and lean protein. Focusing on smart food choices from each of the 5 food groups can help you stay on track at restaurants.  Choose items that are boiled, broiled, grilled, or steamed.  Choose   water, milk, unsweetened iced tea, or other drinks without added sugars. If you want an alcoholic beverage, choose a lower calorie option. For example, a regular margarita can have up to 700 calories and a glass of wine has around 150.  Stay away from items that are buttered, battered, fried, or served with cream sauce. Items labeled "crispy" are usually fried, unless stated otherwise.  Ask for dressings, sauces, and syrups on the side. These are usually very high in calories, so do not eat much of them.  Watch out for salads. Many people think salads are a healthy option, but this is often not the case. Many salads come with bacon, fried chicken, lots of cheese, fried chips, and dressing. All of these items have a lot of calories. If you want a salad, choose a garden salad and ask for grilled meats or steak. Ask for the dressing on the side, or ask for olive oil and vinegar or lemon to use as dressing.  Estimate how many servings of a food you are given. For example, a serving of cooked rice is  cup or about the size of half a tennis ball or one cupcake wrapper. Knowing serving sizes will help you be aware of how much food you are eating at restaurants. The list below tells you how big or small some common portion sizes are based on everyday objects.  1 oz-4 stacked dice.  3 oz-1 deck of cards.  1 tsp-1 dice.  1 Tbsp- a Ping-Pong ball.  2 Tbsp-1 Ping-Pong ball.   cup-1 tennis ball or 1 cupcake wrapper.  1 cup-1 baseball. This information is not intended to replace advice given to you by  your health care provider. Make sure you discuss any questions you have with your health care provider. Document Released: 05/10/2005 Document Revised: 05/31/2014 Document Reviewed: 03/15/2013 Elsevier Interactive Patient Education  2017 [employer]   Hypertension Hypertension, commonly called high blood pressure, is when the force of blood pumping through your arteries is too strong. Your arteries are the blood vessels that carry blood from your heart throughout your body. A blood pressure reading consists of a higher number over a lower number, such as 110/72. The higher number (systolic) is the pressure inside your arteries when your heart pumps. The lower number (diastolic) is the pressure inside your arteries when your heart relaxes. Ideally you want your blood pressure below 120/80. Hypertension forces your heart to work harder to pump blood. Your arteries may become narrow or stiff. Having untreated or uncontrolled hypertension can cause heart attack, stroke, kidney disease, and other problems. What increases the risk? Some risk factors for high blood pressure are controllable. Others are not. Risk factors you cannot control include:  Race. You may be at higher risk if you are African American.  Age. Risk increases with age.  Gender. Men are at higher risk than women before age 42 years. After age 13, women are at higher risk than men. Risk factors you can control include:  Not getting enough exercise or physical activity.  Being overweight.  Getting too much fat, sugar, calories, or salt in your diet.  Drinking too much alcohol. What are the signs or symptoms? Hypertension does not usually cause signs or symptoms. Extremely high blood pressure (hypertensive crisis) may cause headache, anxiety, shortness of breath, and nosebleed. How is this diagnosed? To check if you have hypertension, your health care provider will measure your blood pressure while you are seated, with your  arm held at  the level of your heart. It should be measured at least twice using the same arm. Certain conditions can cause a difference in blood pressure between your right and left arms. A blood pressure reading that is higher than normal on one occasion does not mean that you need treatment. If it is not clear whether you have high blood pressure, you may be asked to return on a different day to have your blood pressure checked again. Or, you may be asked to monitor your blood pressure at home for 1 or more weeks. How is this treated? Treating high blood pressure includes making lifestyle changes and possibly taking medicine. Living a healthy lifestyle can help lower high blood pressure. You may need to change some of your habits. Lifestyle changes may include:  Following the DASH diet. This diet is high in fruits, vegetables, and whole grains. It is low in salt, red meat, and added sugars.  Keep your sodium intake below 2,300 mg per day.  Getting at least 30-45 minutes of aerobic exercise at least 4 times per week.  Losing weight if necessary.  Not smoking.  Limiting alcoholic beverages.  Learning ways to reduce stress. Your health care provider may prescribe medicine if lifestyle changes are not enough to get your blood pressure under control, and if one of the following is true:  You are 68-18 years of age and your systolic blood pressure is above 140.  You are 39 years of age or older, and your systolic blood pressure is above 150.  Your diastolic blood pressure is above 90.  You have diabetes, and your systolic blood pressure is over [id number] or your diastolic blood pressure is over 90.  You have kidney disease and your blood pressure is above 140/90.  You have heart disease and your blood pressure is above 140/90. Your personal target blood pressure may vary depending on your medical conditions, your age, and other factors. Follow these instructions at home:  Have your blood  pressure rechecked as directed by your health care provider.  Take medicines only as directed by your health care provider. Follow the directions carefully. Blood pressure medicines must be taken as prescribed. The medicine does not work as well when you skip doses. Skipping doses also puts you at risk for problems.  Do not smoke.  Monitor your blood pressure at home as directed by your health care provider. Contact a health care provider if:  You think you are having a reaction to medicines taken.  You have recurrent headaches or feel dizzy.  You have swelling in your ankles.  You have trouble with your vision. Get help right away if:  You develop a severe headache or confusion.  You have unusual weakness, numbness, or feel faint.  You have severe chest or abdominal pain.  You vomit repeatedly.  You have trouble breathing. This information is not intended to replace advice given to you by your health care provider. Make sure you discuss any questions you have with your health care provider. Document Released: 05/10/2005 Document Revised: 10/16/2015 Document Reviewed: 03/02/2013 Elsevier Interactive Patient Education  2017 [employer].

## 2016-05-20 ENCOUNTER — Encounter: Payer: Self-pay | Admitting: Family Medicine

## 2016-05-20 ENCOUNTER — Ambulatory Visit (INDEPENDENT_AMBULATORY_CARE_PROVIDER_SITE_OTHER): Payer: 59 | Admitting: Family Medicine

## 2016-05-20 VITALS — BP 132/84 | HR 74 | Temp 97.8°F | Resp 16 | Wt 204.8 lb

## 2016-05-20 DIAGNOSIS — B9789 Other viral agents as the cause of diseases classified elsewhere: Secondary | ICD-10-CM | POA: Diagnosis not present

## 2016-05-20 DIAGNOSIS — J069 Acute upper respiratory infection, unspecified: Secondary | ICD-10-CM

## 2016-05-20 MED ORDER — HYDROCODONE-HOMATROPINE 5-1.5 MG/5ML PO SYRP: ORAL_SOLUTION | ORAL | 0 refills | Status: DC

## 2016-05-20 MED ORDER — DOXYCYCLINE HYCLATE 100 MG PO TABS: 100.0000 mg | ORAL_TABLET | Freq: Two times a day (BID) | ORAL | 0 refills | Status: DC

## 2016-05-20 NOTE — Patient Instructions (Addendum)
Discussed use of Mucinex D for congestion and Benadryl at night for post nasal drainage. Try warm, wet compresses for any eye matting.

## 2016-05-20 NOTE — Progress Notes (Signed)
Subjective:     Patient ID: Artist Beach, female   DOB: [birth year], 60 y.o.   MRN: [id number]  HPI  Chief Complaint  Patient presents with  . Sore Throat    Patient comes in office today with concerns of sore throat, cough and congestion since 05/15/16. Patient reports that her cough is dry and has pressure in both ears, this morning when waking patient states that she had crusting of both eye lids and has noticed more frequently in left eye. Patient reports that she taken otc Mucinex for relief.   No change in vision or red eye reported.   Review of Systems     Objective:   Physical Exam  Constitutional: She appears well-developed and well-nourished. No distress.  Ears: T.M's intact without inflammation Eyes: no matting/discharge noted Sinuses: non-tender Throat: no tonsillar enlargement or exudate Neck: no cervical adenopathy Lungs: clear     Assessment:    1. Viral upper respiratory tract infection - HYDROcodone-homatropine (HYCODAN) 5-1.5 MG/5ML syrup; 5 ml 4-6 hours as needed for cough  Dispense: 240 mL; Refill: 0 - doxycycline (VIBRA-TABS) 100 MG tablet; Take 1 tablet (100 mg total) by mouth 2 (two) times daily.  Dispense: 20 tablet; Refill: 0    Plan:    Discussed use of otc medication and warm eye compresses. If sinuses not improving over the next 2-3 days to start the antibiotic

## 2016-06-22 ENCOUNTER — Ambulatory Visit: Payer: 59 | Admitting: Physician Assistant

## 2016-07-18 ENCOUNTER — Encounter: Payer: Self-pay | Admitting: Physician Assistant

## 2016-11-16 ENCOUNTER — Encounter: Payer: Self-pay | Admitting: Physician Assistant

## 2016-11-16 ENCOUNTER — Ambulatory Visit (INDEPENDENT_AMBULATORY_CARE_PROVIDER_SITE_OTHER): Payer: 59 | Admitting: Physician Assistant

## 2016-11-16 VITALS — BP 136/86 | HR 64 | Temp 97.8°F | Resp 16 | Ht 63.0 in | Wt 181.0 lb

## 2016-11-16 DIAGNOSIS — I10 Essential (primary) hypertension: Secondary | ICD-10-CM

## 2016-11-16 DIAGNOSIS — E559 Vitamin D deficiency, unspecified: Secondary | ICD-10-CM

## 2016-11-16 DIAGNOSIS — Z1211 Encounter for screening for malignant neoplasm of colon: Secondary | ICD-10-CM

## 2016-11-16 DIAGNOSIS — Z0000 Encounter for general adult medical examination without abnormal findings: Secondary | ICD-10-CM

## 2016-11-16 DIAGNOSIS — E038 Other specified hypothyroidism: Secondary | ICD-10-CM

## 2016-11-16 DIAGNOSIS — N6091 Unspecified benign mammary dysplasia of right breast: Secondary | ICD-10-CM

## 2016-11-16 DIAGNOSIS — Z6832 Body mass index (BMI) 32.0-32.9, adult: Secondary | ICD-10-CM

## 2016-11-16 DIAGNOSIS — Z126 Encounter for screening for malignant neoplasm of bladder: Secondary | ICD-10-CM | POA: Diagnosis not present

## 2016-11-16 DIAGNOSIS — E7800 Pure hypercholesterolemia, unspecified: Secondary | ICD-10-CM | POA: Diagnosis not present

## 2016-11-16 DIAGNOSIS — Z1231 Encounter for screening mammogram for malignant neoplasm of breast: Secondary | ICD-10-CM

## 2016-11-16 DIAGNOSIS — Z1239 Encounter for other screening for malignant neoplasm of breast: Secondary | ICD-10-CM

## 2016-11-16 LAB — POCT URINALYSIS DIPSTICK
Bilirubin, UA: NEGATIVE
Glucose, UA: NEGATIVE
Ketones, UA: NEGATIVE
LEUKOCYTES UA: NEGATIVE
NITRITE UA: NEGATIVE
PH UA: 6 (ref 5.0–8.0)
PROTEIN UA: NEGATIVE
RBC UA: NEGATIVE
Spec Grav, UA: <=1.005 — AB (ref 1.010–1.025)
UROBILINOGEN UA: 0.2 U/dL

## 2016-11-16 LAB — IFOBT (OCCULT BLOOD): IMMUNOLOGICAL FECAL OCCULT BLOOD TEST: NEGATIVE

## 2016-11-16 NOTE — Progress Notes (Signed)
Patient: Vanessa Austin, Female    DOB: Apr 12, 1956, 61 y.o.   MRN: [id number] Visit Date: 11/16/2016  Today's Provider: Mar Daring, PA-C   Chief Complaint  Patient presents with  . Annual Exam   Subjective:    Annual physical exam Vanessa Austin is a 61 y.o. female who presents today for health maintenance and complete physical. She feels well. She reports exercising 3-4 days a week. She reports she is sleeping well.  11/12/15 CPE 11/12/15 Pap-neg; HPV-neg 07/11/08 Colonoscopy-normal, Dr. Allen Norris.   12/08/2015 Mammogram-Abnormal-underwent lumpectomy with Dr. Nicholes Stairs that revealed atypical ductal hyperplasia of the right breast. -----------------------------------------------------------------   Review of Systems  Constitutional: Negative.   HENT: Negative.   Eyes: Negative.   Respiratory: Negative.   Cardiovascular: Negative.   Gastrointestinal: Negative.   Endocrine: Negative.   Genitourinary: Negative.   Musculoskeletal: Negative.   Skin: Negative.   Allergic/Immunologic: Negative.   Neurological: Negative.   Hematological: Negative.   Psychiatric/Behavioral: Negative.     Social History      She  reports that she quit smoking about 36 years ago. She has a 2.50 pack-year smoking history. She has never used smokeless tobacco. She reports that she drinks about 4.2 oz of alcohol per week . She reports that she does not use drugs.       Social History   Social History  . Marital status: Married    Spouse name: Nadara Mustard  . Number of children: 3  . Years of education: College   Occupational History  . [employer]   Social History Main Topics  . Smoking status: Former Smoker    Packs/day: 0.25    Years: 10.00    Quit date: 05/23/1980  . Smokeless tobacco: Never Used  . Alcohol use 4.2 oz/week    7 Glasses of wine per week  . Drug use: No  . Sexual activity: Yes   Other Topics Concern  . None   Social History Narrative  .  None    Past Medical History:  Diagnosis Date  . Allergy   . Hypertension   . Hypothyroidism   . Thyroid disease      Patient Active Problem List   Diagnosis Date Noted  . Atypical ductal hyperplasia of right breast 03/01/2016  . Hypercholesterolemia 11/12/2015  . Palpitations 01/07/2015  . Hypothyroidism 10/30/2014  . Routine general medical examination at a health care facility 08/24/2014  . Chicken pox 08/24/2014  . 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 BP (high blood pressure) 08/24/2014  . Adiposity 08/24/2014  . Avitaminosis D 08/24/2014  . Carpal tunnel syndrome 12/12/2009  . Female genuine stress incontinence 06/12/2008    Past Surgical History:  Procedure Laterality Date  . BILATERAL CARPAL TUNNEL RELEASE Bilateral 10/01/2010   Dr. Leanor Kail  . BREAST EXCISIONAL BIOPSY Right 01/27/2016   papilloma and complex sclerosing lesion removed  . BREAST LUMPECTOMY WITH NEEDLE LOCALIZATION Right 01/27/2016   Procedure: BREAST LUMPECTOMY WITH NEEDLE LOCALIZATION;  Surgeon: Leonie Green, MD;  Location: [facility] ORS;  Service: General;  Laterality: Right;    Family History        Family Status  Relation Status  . Mother Deceased at age 63  . Father Alive  . Brother Alive  . Brother Alive        Her family history includes Brain cancer in her mother; Heart disease in her father; Hypertension in her brother, father, and mother.  Allergies  Allergen Reactions  . Etodolac   . Penicillins   . Sulfa Antibiotics   . Meloxicam Rash     Current Outpatient Prescriptions:  .  Cetirizine HCl 10 MG CAPS, Take 10 mg by mouth daily., Disp: , Rfl:  .  Cholecalciferol 10000 UNITS CAPS, Take by mouth daily., Disp: , Rfl:  .  fluticasone (FLONASE) 50 MCG/ACT nasal spray, Place 2 sprays into the nose daily., Disp: , Rfl:  .  levothyroxine (SYNTHROID, LEVOTHROID) 125 MCG tablet, Take 1 tablet (125 mcg total) by mouth daily., Disp: 90 tablet, Rfl: 3 .  lisinopril  (PRINIVIL,ZESTRIL) 5 MG tablet, Take 1 tablet (5 mg total) by mouth daily., Disp: 90 tablet, Rfl: 3   Patient Care Team: Mar Daring, PA-C as PCP - General (Family Medicine)      Objective:   Vitals: BP 136/86 (BP Location: Left Arm, Patient Position: Sitting, Cuff Size: Large)   Pulse 64   Temp 97.8 F (36.6 C) (Oral)   Resp 16   Ht 5\' 3"  (1.6 m)   Wt 181 lb (82.1 kg)   SpO2 99%   BMI 32.06 kg/m    Vitals:   11/16/16 0902  BP: 136/86  Pulse: 64  Resp: 16  Temp: 97.8 F (36.6 C)  TempSrc: Oral  SpO2: 99%  Weight: 181 lb (82.1 kg)  Height: 5\' 3"  (1.6 m)     Physical Exam  Constitutional: She is oriented to person, place, and time. She appears well-developed and well-nourished. No distress.  HENT:  Head: Normocephalic and atraumatic.  Right Ear: Hearing, tympanic membrane, external ear and ear canal normal.  Left Ear: Hearing, tympanic membrane, external ear and ear canal normal.  Nose: Nose normal.  Mouth/Throat: Uvula is midline, oropharynx is clear and moist and mucous membranes are normal. No oropharyngeal exudate.  Eyes: Conjunctivae and EOM are normal. Pupils are equal, round, and reactive to light. Right eye exhibits no discharge. Left eye exhibits no discharge. No scleral icterus.  Neck: Normal range of motion. Neck supple. No JVD present. Carotid bruit is not present. No tracheal deviation present. No thyromegaly present.  Cardiovascular: Normal rate, regular rhythm, normal heart sounds and intact distal pulses.  Exam reveals no gallop and no friction rub.   No murmur heard. Pulmonary/Chest: Effort normal and breath sounds normal. No respiratory distress. She has no wheezes. She has no rales. She exhibits no tenderness. Right breast exhibits no inverted nipple, no mass, no nipple discharge, no skin change and no tenderness. Left breast exhibits no inverted nipple, no mass, no nipple discharge, no skin change and no tenderness. Breasts are symmetrical.     Abdominal: Soft. Bowel sounds are normal. She exhibits no distension and no mass. There is no tenderness. There is no rebound and no guarding. Hernia confirmed negative in the right inguinal area and confirmed negative in the left inguinal area.  Genitourinary: Rectum normal, vagina normal and uterus normal. No breast swelling, tenderness, discharge or bleeding. Pelvic exam was performed with patient supine. There is no rash, tenderness, lesion or injury on the right labia. There is no rash, tenderness, lesion or injury on the left labia. Cervix exhibits no motion tenderness, no discharge and no friability. Right adnexum displays no mass, no tenderness and no fullness. Left adnexum displays no mass, no tenderness and no fullness. No erythema, tenderness or bleeding in the vagina. No signs of injury around the vagina. No vaginal discharge found.  Musculoskeletal: Normal range of motion. She exhibits  no edema or tenderness.  Lymphadenopathy:    She has no cervical adenopathy.       Right: No inguinal adenopathy present.       Left: No inguinal adenopathy present.  Neurological: She is alert and oriented to person, place, and time. She has normal reflexes. No cranial nerve deficit. Coordination normal.  Skin: Skin is warm and dry. No rash noted. She is not diaphoretic.  Psychiatric: She has a normal mood and affect. Her behavior is normal. Judgment and thought content normal.  Vitals reviewed.    Depression Screen PHQ 2/9 Scores 11/16/2016 11/12/2015  PHQ - 2 Score 0 0  PHQ- 9 Score 0 -      Assessment & Plan:     Routine Health Maintenance and Physical Exam  Exercise Activities and Dietary recommendations Goals    None      Immunization History  Administered Date(s) Administered  . Influenza,inj,Quad PF,36+ Mos 05/13/2015, 04/19/2016  . Td 04/09/2004  . Tdap 10/24/2014    Health Maintenance  Topic Date Due  . Hepatitis C Screening  20-Jan-1956  . HIV Screening  03/18/1972   . INFLUENZA VACCINE  12/22/2016  . MAMMOGRAM  12/07/2017  . COLONOSCOPY  07/11/2018  . PAP SMEAR  11/12/2018  . TETANUS/TDAP  10/23/2024     Discussed health benefits of physical activity, and encouraged her to engage in regular exercise appropriate for her age and condition.    1. Annual physical exam Normal physical exam today. Will check labs as below and f/u pending lab results. If labs are stable and WNL she will not need to have these rechecked for one year at her next annual physical exam. She is to call the office in the meantime if she has any acute issue, questions or concerns.  2. Breast cancer screening Breast exam today was normal. Does have personal history of lumpectomy in 2017 with Dr. Rochel Brome that was positive for atypical ductal hyperplasia. There is no family history of breast cancer. She does perform regular self breast exams. Mammogram was ordered as below. Information for [facility] clinic was given to patient so she may schedule her mammogram at her convenience. - MM DIAG BREAST TOMO BILATERAL; Future  3. Atypical ductal hyperplasia of right breast See above medical treatment plan. - MM DIAG BREAST TOMO BILATERAL; Future  4. Colon cancer screening OC lite collected today and was negative. - IFOBT POC (occult bld, rslt in office)  5. Bladder cancer screening UA negative.  - POCT Urinalysis Dipstick  6. Essential hypertension Stable. Continue Lisinopril 5mg . Will check labs as below and f/u pending results. - CBC with Differential/Platelet - Comprehensive metabolic panel  7. Hypercholesterolemia Stable and diet controlled. Will check labs as below and f/u pending results. - Lipid Panel With LDL/HDL Ratio  8. Avitaminosis D H/O this. On supplementation of 1000IU daily. Will check labs as below and f/u pending results. - VITAMIN D 25 Hydroxy (Vit-D Deficiency, Fractures)  9. Other specified hypothyroidism Stable on levothyroxine 159mcg. Will  check labs as below and f/u pending results. - TSH  10. BMI 32.0-32.9,adult Counseled patient on healthy lifestyle modifications including dieting and exercise.   --------------------------------------------------------------------    Mar Daring, PA-C  [place]

## 2016-11-16 NOTE — Patient Instructions (Signed)

## 2016-11-17 LAB — CBC WITH DIFFERENTIAL/PLATELET
Basophils Absolute: 0.1 10*3/uL (ref 0.0–0.2)
Basos: 1 %
EOS (ABSOLUTE): 0.1 10*3/uL (ref 0.0–0.4)
EOS: 2 %
HEMATOCRIT: 41.7 % (ref 34.0–46.6)
HEMOGLOBIN: 13.7 g/dL (ref 11.1–15.9)
IMMATURE GRANS (ABS): 0 10*3/uL (ref 0.0–0.1)
IMMATURE GRANULOCYTES: 0 %
LYMPHS: 30 %
Lymphocytes Absolute: 1.8 10*3/uL (ref 0.7–3.1)
MCH: 30 pg (ref 26.6–33.0)
MCHC: 32.9 g/dL (ref 31.5–35.7)
MCV: 91 fL (ref 79–97)
MONOCYTES: 10 %
MONOS ABS: 0.6 10*3/uL (ref 0.1–0.9)
NEUTROS PCT: 57 %
Neutrophils Absolute: 3.4 10*3/uL (ref 1.4–7.0)
Platelets: 319 10*3/uL (ref 150–379)
RBC: 4.56 x10E6/uL (ref 3.77–5.28)
RDW: 13.8 % (ref 12.3–15.4)
WBC: 5.9 10*3/uL (ref 3.4–10.8)

## 2016-11-17 LAB — COMPREHENSIVE METABOLIC PANEL
ALT: 18 IU/L (ref 0–32)
AST: 19 IU/L (ref 0–40)
Albumin/Globulin Ratio: 2 (ref 1.2–2.2)
Albumin: 4.7 g/dL (ref 3.5–5.5)
Alkaline Phosphatase: 73 IU/L (ref 39–117)
BUN/Creatinine Ratio: 20 (ref 9–23)
BUN: 17 mg/dL (ref 6–24)
Bilirubin Total: 0.3 mg/dL (ref 0.0–1.2)
CALCIUM: 9.8 mg/dL (ref 8.7–10.2)
CO2: 24 mmol/L (ref 20–29)
CREATININE: 0.86 mg/dL (ref 0.57–1.00)
Chloride: 102 mmol/L (ref 96–106)
GFR calc Af Amer: 86 mL/min/{1.73_m2} (ref >59)
GFR, EST NON AFRICAN AMERICAN: 74 mL/min/{1.73_m2} (ref >59)
GLOBULIN, TOTAL: 2.4 g/dL (ref 1.5–4.5)
Glucose: 89 mg/dL (ref 65–99)
Potassium: 4.7 mmol/L (ref 3.5–5.2)
SODIUM: 141 mmol/L (ref 134–144)
TOTAL PROTEIN: 7.1 g/dL (ref 6.0–8.5)

## 2016-11-17 LAB — LIPID PANEL WITH LDL/HDL RATIO
Cholesterol, Total: 249 mg/dL — ABNORMAL HIGH (ref 100–199)
HDL: 70 mg/dL (ref >39)
LDL CALC: 163 mg/dL — AB (ref 0–99)
LDL/HDL RATIO: 2.3 ratio (ref 0.0–3.2)
TRIGLYCERIDES: 78 mg/dL (ref 0–149)
VLDL Cholesterol Cal: 16 mg/dL (ref 5–40)

## 2016-11-17 LAB — TSH: TSH: 3.9 u[IU]/mL (ref 0.450–4.500)

## 2016-11-17 LAB — VITAMIN D 25 HYDROXY (VIT D DEFICIENCY, FRACTURES): Vit D, 25-Hydroxy: 38.1 ng/mL (ref 30.0–100.0)

## 2016-11-18 ENCOUNTER — Telehealth: Payer: Self-pay

## 2016-11-18 NOTE — Telephone Encounter (Signed)
-----   Message from Mar Daring, PA-C sent at 11/17/2016  4:48 PM EDT ----- Cholesterol has improved from last year. ASCVD 10 yr risk is low at 4.54%. At this time there is no need to start a cholesterol lowering medication. Continue healthy lifestyle modifications. All other labs are normal. Thanks. -JB

## 2016-11-18 NOTE — Telephone Encounter (Signed)
Pt returned call ° °teri °

## 2016-11-18 NOTE — Telephone Encounter (Signed)
Patient advised.

## 2016-11-18 NOTE — Telephone Encounter (Signed)
LMTCB

## 2016-11-26 ENCOUNTER — Other Ambulatory Visit: Payer: Self-pay | Admitting: Physician Assistant

## 2016-11-26 DIAGNOSIS — Z1231 Encounter for screening mammogram for malignant neoplasm of breast: Secondary | ICD-10-CM

## 2016-11-30 ENCOUNTER — Other Ambulatory Visit: Payer: Self-pay | Admitting: Physician Assistant

## 2016-11-30 MED ORDER — LEVOTHYROXINE SODIUM 125 MCG PO TABS: 125.0000 ug | ORAL_TABLET | Freq: Every day | ORAL | 3 refills | Status: DC

## 2016-11-30 NOTE — Telephone Encounter (Signed)
[facility] faxed a request on the following medication. Thanks CC  levothyroxine (SYNTHROID, LEVOTHROID) 125 MCG tablet  > Take 1 tablet by mouth once daily.

## 2016-11-30 NOTE — Telephone Encounter (Signed)
Please review. Thanks!  

## 2016-12-10 ENCOUNTER — Ambulatory Visit
Admission: RE | Admit: 2016-12-10 | Discharge: 2016-12-10 | Disposition: A | Discharge status: Home / Self Care | Payer: 59 | Source: Ambulatory Visit | Attending: Physician Assistant | Admitting: Physician Assistant

## 2016-12-10 ENCOUNTER — Other Ambulatory Visit: Payer: Self-pay | Admitting: Physician Assistant

## 2016-12-10 DIAGNOSIS — R928 Other abnormal and inconclusive findings on diagnostic imaging of breast: Secondary | ICD-10-CM

## 2016-12-10 DIAGNOSIS — Z1231 Encounter for screening mammogram for malignant neoplasm of breast: Secondary | ICD-10-CM | POA: Insufficient documentation

## 2016-12-10 DIAGNOSIS — N6320 Unspecified lump in the left breast, unspecified quadrant: Secondary | ICD-10-CM

## 2016-12-16 ENCOUNTER — Ambulatory Visit
Admission: RE | Admit: 2016-12-16 | Discharge: 2016-12-16 | Disposition: A | Discharge status: Home / Self Care | Payer: 59 | Source: Ambulatory Visit | Attending: Physician Assistant | Admitting: Physician Assistant

## 2016-12-16 DIAGNOSIS — N6320 Unspecified lump in the left breast, unspecified quadrant: Secondary | ICD-10-CM | POA: Diagnosis not present

## 2016-12-16 DIAGNOSIS — R928 Other abnormal and inconclusive findings on diagnostic imaging of breast: Secondary | ICD-10-CM | POA: Diagnosis present

## 2017-01-04 ENCOUNTER — Other Ambulatory Visit: Payer: Self-pay | Admitting: Physician Assistant

## 2017-01-04 DIAGNOSIS — I10 Essential (primary) hypertension: Secondary | ICD-10-CM

## 2017-01-04 MED ORDER — LISINOPRIL 5 MG PO TABS: 5.0000 mg | ORAL_TABLET | Freq: Every day | ORAL | 3 refills | Status: DC

## 2017-01-04 NOTE — Telephone Encounter (Signed)
Please review. Thanks!  

## 2017-01-04 NOTE — Telephone Encounter (Signed)
[facility] faxed refill request for the following medications: lisinopril (PRINIVIL,ZESTRIL) 5 MG tablet  90 day supply  Last Rx: 11/17/15 LOV: 11/16/16 Please advise. Thanks TNP

## 2017-01-25 ENCOUNTER — Other Ambulatory Visit: Payer: Self-pay | Admitting: Physician Assistant

## 2017-01-25 DIAGNOSIS — I10 Essential (primary) hypertension: Secondary | ICD-10-CM

## 2017-01-25 MED ORDER — LISINOPRIL 5 MG PO TABS: 5.0000 mg | ORAL_TABLET | Freq: Every day | ORAL | 3 refills | Status: DC

## 2017-01-25 NOTE — Telephone Encounter (Signed)
[facility] faxed a request on the following medication. Thanks CC  lisinopril (PRINIVIL,ZESTRIL) 5 MG tablet

## 2017-01-25 NOTE — Telephone Encounter (Signed)
It looks like it was ordered last month and was sent to [facility] but [facility] is requesting this time.

## 2017-03-02 ENCOUNTER — Ambulatory Visit: Payer: 59 | Admitting: Internal Medicine

## 2017-03-09 ENCOUNTER — Inpatient Hospital Stay: Payer: 59 | Attending: Internal Medicine | Admitting: Internal Medicine

## 2017-03-09 ENCOUNTER — Telehealth: Payer: Self-pay | Admitting: Internal Medicine

## 2017-03-09 VITALS — BP 152/89 | HR 69 | Temp 95.4°F | Resp 18 | Wt 167.6 lb

## 2017-03-09 DIAGNOSIS — Z79899 Other long term (current) drug therapy: Secondary | ICD-10-CM | POA: Diagnosis not present

## 2017-03-09 DIAGNOSIS — E039 Hypothyroidism, unspecified: Secondary | ICD-10-CM | POA: Insufficient documentation

## 2017-03-09 DIAGNOSIS — Z87891 Personal history of nicotine dependence: Secondary | ICD-10-CM | POA: Insufficient documentation

## 2017-03-09 DIAGNOSIS — Z808 Family history of malignant neoplasm of other organs or systems: Secondary | ICD-10-CM | POA: Diagnosis not present

## 2017-03-09 DIAGNOSIS — N6091 Unspecified benign mammary dysplasia of right breast: Secondary | ICD-10-CM

## 2017-03-09 DIAGNOSIS — I10 Essential (primary) hypertension: Secondary | ICD-10-CM | POA: Insufficient documentation

## 2017-03-09 NOTE — Telephone Encounter (Signed)
Just remind the pt that she needs a mammogram - "Six-month follow-up diagnostic left breast mammogram and ultrasound is recommended".   Please make sure she talks to PCP re: ordering the mammo/US. Thx

## 2017-03-09 NOTE — Telephone Encounter (Signed)
Voice mail msg left for patient- reminding patient for the need of f/u mammogram. I asked patient to contact her pcp to arrange for mammo. I asked pt to call our office back should the pcp not order the mammogram.

## 2017-03-09 NOTE — Assessment & Plan Note (Addendum)
#   Atypical ductal hyperplasia of the right breast status post lumpectomy. Clear margins [id number]; Dr.Smith]. After lengthy discussion declined tamoxifen for chemoprevention.  # clinical evidence of any recurrence- recommend continued of monthly self breast exams; and also mammograms.   # Left breast/mammogram abnormality in July 2018- subsequent mammogram ultrasound- cysts. Follow-up mammograms/ through PCP. The patient is needing mammogram on left breast in Jan 2018 as recommended by radiology. Defer to PCP re: ordering of mammogram.   # Follow-up only as needed.  Cc: Lillia Abed; NP.

## 2017-03-09 NOTE — Progress Notes (Signed)
[place] NOTE  Patient Care Team: Rubye Beach as PCP - General (Family Medicine)  CHIEF COMPLAINTS/PURPOSE OF CONSULTATION:    #  RIGHT BREAST ATYPICAL DUCTAL HYPERPLASIA [s/p Lumpectomy; dr.Smith]; NO TAMOXIFEN   No history exists.   HISTORY OF PRESENTING ILLNESS:  Vanessa Austin 61 y.o.  female history of right breast atypical ductal hyperplasia status post lumpectomy; declined tamoxifen for chemoprevention is here for follow-up.  Patient limb has lost weight; intentional better eating habits/exercise. Patient had a mammogram of the left breast which showed slight abnormality- repeat ultrasound/mammogram showed a 3 mm cyst. No biopsy done.  She denies any new lumps or bumps. Appetite is good. No nausea no vomiting or headaches.  ROS: A complete 10 point review of system is done which is negative except mentioned above in history of present illness  MEDICAL HISTORY:  Past Medical History:  Diagnosis Date  . Allergy   . Hypertension   . Hypothyroidism   . Thyroid disease     SURGICAL HISTORY: Past Surgical History:  Procedure Laterality Date  . BILATERAL CARPAL TUNNEL RELEASE Bilateral 10/01/2010   Dr. Leanor Kail  . BREAST EXCISIONAL BIOPSY Right 01/27/2016   papilloma and complex sclerosing lesion removed  . BREAST LUMPECTOMY WITH NEEDLE LOCALIZATION Right 01/27/2016   Procedure: BREAST LUMPECTOMY WITH NEEDLE LOCALIZATION;  Surgeon: Leonie Green, MD;  Location: [facility] ORS;  Service: General;  Laterality: Right;    SOCIAL HISTORY: work at [employer]. [place]. occasional alochol; remote history of smoking.  Social History   Social History  . Marital status: Married    Spouse name: Nadara Mustard  . Number of children: 3  . Years of education: College   Occupational History  . [employer]   Social History Main Topics  . Smoking status: Former Smoker    Packs/day: 0.25    Years: 10.00    Quit date:  05/23/1980  . Smokeless tobacco: Never Used  . Alcohol use 4.2 oz/week    7 Glasses of wine per week  . Drug use: No  . Sexual activity: Yes   Other Topics Concern  . Not on file   Social History Narrative  . No narrative on file    FAMILY HISTORY: GBM-mom 80s;  Family History  Problem Relation Age of Onset  . Hypertension Mother   . Brain cancer Mother   . Hypertension Father   . Heart disease Father   . Hypertension Brother     ALLERGIES:  is allergic to etodolac; penicillins; sulfa antibiotics; and meloxicam.  MEDICATIONS:  Current Outpatient Prescriptions  Medication Sig Dispense Refill  . Cetirizine HCl 10 MG CAPS Take 10 mg by mouth daily.    . Cholecalciferol 10000 UNITS CAPS Take by mouth daily.    . fluticasone (FLONASE) 50 MCG/ACT nasal spray Place 2 sprays into the nose daily.    Marland Kitchen levothyroxine (SYNTHROID, LEVOTHROID) 125 MCG tablet Take 1 tablet (125 mcg total) by mouth daily. 90 tablet 3  . lisinopril (PRINIVIL,ZESTRIL) 5 MG tablet Take 1 tablet (5 mg total) by mouth daily. 90 tablet 3   No current facility-administered medications for this visit.       Marland Kitchen  PHYSICAL EXAMINATION: ECOG PERFORMANCE STATUS: 0 - Asymptomatic  Vitals:   03/09/17 1055  BP: (!) 152/89  Pulse: 69  Resp: 18  Temp: (!) 95.4 F (35.2 C)   Filed Weights   03/09/17 1055  Weight: 167 lb 9.6  oz (76 kg)    GENERAL: Well-nourished well-developed; Alert, no distress and comfortable. She is alone.  EYES: no pallor or icterus OROPHARYNX: no thrush or ulceration; good dentition  NECK: supple, no masses felt LYMPH:  no palpable lymphadenopathy in the cervical, axillary or inguinal regions LUNGS: clear to auscultation and  No wheeze or crackles HEART/CVS: regular rate & rhythm and no murmurs; No lower extremity edema ABDOMEN: abdomen soft, non-tender and normal bowel sounds Musculoskeletal:no cyanosis of digits and no clubbing  PSYCH: alert & oriented x 3 with fluent  speech NEURO: no focal motor/sensory deficits SKIN:  no rashes or significant lesions  LABORATORY DATA:  I have reviewed the data as listed Lab Results  Component Value Date   WBC 5.9 11/16/2016   HGB 13.7 11/16/2016   HCT 41.7 11/16/2016   MCV 91 11/16/2016   PLT 319 11/16/2016    Recent Labs  11/16/16 0955  NA 141  K 4.7  CL 102  CO2 24  GLUCOSE 89  BUN 17  CREATININE 0.86  CALCIUM 9.8  GFRNONAA 74  GFRAA 86  PROT 7.1  ALBUMIN 4.7  AST 19  ALT 18  ALKPHOS 73  BILITOT 0.3    RADIOGRAPHIC STUDIES: I have personally reviewed the radiological images as listed and agreed with the findings in the report. No results found.  ASSESSMENT & PLAN:   Atypical ductal hyperplasia of right breast # Atypical ductal hyperplasia of the right breast status post lumpectomy. Clear margins [id number]; Dr.Smith]. After lengthy discussion declined tamoxifen for chemoprevention.  # clinical evidence of any recurrence- recommend continued of monthly self breast exams; and also mammograms.   # Left breast/mammogram abnormality in July 2018- subsequent mammogram ultrasound- cysts. Follow-up mammograms/ through PCP. The patient is needing mammogram on left breast in Jan 2018 as recommended by radiology. Defer to PCP re: ordering of mammogram.   # Follow-up only as needed.  Cc: Lillia Abed; NP.     Cammie Sickle, MD 03/09/2017 11:16 AM

## 2017-03-09 NOTE — Progress Notes (Signed)
Here for follow up. Doing well she stated  

## 2017-03-15 ENCOUNTER — Telehealth: Payer: Self-pay | Admitting: *Deleted

## 2017-03-15 DIAGNOSIS — R928 Other abnormal and inconclusive findings on diagnostic imaging of breast: Secondary | ICD-10-CM

## 2017-03-15 NOTE — Telephone Encounter (Signed)
Asking to be scheduled for a mammogram per message from Vickki Muff, RN per patient

## 2017-03-15 NOTE — Telephone Encounter (Signed)
RECOMMENDATION:Six-month follow-up diagnostic left breast mammogram and ultrasound is recommended- early feb 2018. Please order.

## 2017-03-16 NOTE — Telephone Encounter (Signed)
Order entered and message to scheduling

## 2017-03-16 NOTE — Addendum Note (Signed)
Addended by: Betti Cruz on: 03/16/2017 09:33 AM   Modules accepted: Orders

## 2017-04-25 ENCOUNTER — Telehealth: Payer: Self-pay | Admitting: Physician Assistant

## 2017-04-25 DIAGNOSIS — R6889 Other general symptoms and signs: Secondary | ICD-10-CM

## 2017-04-25 NOTE — Telephone Encounter (Signed)
Pt stated that she would like to have her thyroid rechecked because she stated that she has a chill that she can't get rid of. Pt stated that her hands and feet are always cold. Pt stated that she has lost 40 pounds since January but she has been trying to lose weight with diet & exercise. Please advise. Thanks TNP

## 2017-04-25 NOTE — Telephone Encounter (Signed)
Patient advised as below.  

## 2017-04-25 NOTE — Telephone Encounter (Signed)
Please Review.  Last office visit:11/16/2016  TSH:11/16/16 was 3.900

## 2017-04-25 NOTE — Telephone Encounter (Signed)
Labs ordered.

## 2017-04-26 ENCOUNTER — Telehealth: Payer: Self-pay

## 2017-04-26 DIAGNOSIS — E039 Hypothyroidism, unspecified: Secondary | ICD-10-CM

## 2017-04-26 NOTE — Telephone Encounter (Signed)
Patient's husband came in to get the lab requisition re order labs since initially it was for [facility] but patent's husband stated it was with [facility].

## 2017-04-29 LAB — T4: T4, Total: 6.4 ug/dL (ref 4.5–12.0)

## 2017-04-29 LAB — TSH: TSH: 3.79 u[IU]/mL (ref 0.450–4.500)

## 2017-06-22 ENCOUNTER — Ambulatory Visit
Admission: RE | Admit: 2017-06-22 | Discharge: 2017-06-22 | Disposition: A | Discharge status: Home / Self Care | Payer: 59 | Source: Ambulatory Visit | Attending: Internal Medicine | Admitting: Internal Medicine

## 2017-06-22 DIAGNOSIS — R928 Other abnormal and inconclusive findings on diagnostic imaging of breast: Secondary | ICD-10-CM

## 2017-10-12 ENCOUNTER — Other Ambulatory Visit: Payer: Self-pay | Admitting: Physician Assistant

## 2017-10-12 DIAGNOSIS — Z1231 Encounter for screening mammogram for malignant neoplasm of breast: Secondary | ICD-10-CM

## 2017-12-01 LAB — TSH: TSH: 2.99 mIU/L (ref 0.40–4.50)

## 2017-12-01 LAB — T4: T4, Total: 7.6 ug/dL (ref 5.1–11.9)

## 2017-12-12 ENCOUNTER — Ambulatory Visit
Admission: RE | Admit: 2017-12-12 | Discharge: 2017-12-12 | Disposition: A | Discharge status: Home / Self Care | Payer: 59 | Source: Ambulatory Visit | Attending: Physician Assistant | Admitting: Physician Assistant

## 2017-12-12 ENCOUNTER — Telehealth: Payer: Self-pay

## 2017-12-12 DIAGNOSIS — Z1231 Encounter for screening mammogram for malignant neoplasm of breast: Secondary | ICD-10-CM | POA: Insufficient documentation

## 2017-12-12 NOTE — Telephone Encounter (Signed)
Patient advised as directed below.  Thanks,  -Kaitrin Seybold 

## 2017-12-12 NOTE — Telephone Encounter (Signed)
-----   Message from Mar Daring, PA-C sent at 12/12/2017  1:15 PM EDT ----- Normal mammogram. Repeat screening in one year.

## 2018-01-11 ENCOUNTER — Ambulatory Visit
Admission: RE | Admit: 2018-01-11 | Discharge: 2018-01-11 | Disposition: A | Discharge status: Home / Self Care | Payer: 59 | Source: Ambulatory Visit | Attending: *Deleted | Admitting: *Deleted

## 2018-01-11 ENCOUNTER — Other Ambulatory Visit: Payer: Self-pay | Admitting: *Deleted

## 2018-01-11 DIAGNOSIS — M79642 Pain in left hand: Secondary | ICD-10-CM | POA: Insufficient documentation

## 2018-01-11 DIAGNOSIS — M79641 Pain in right hand: Secondary | ICD-10-CM | POA: Insufficient documentation

## 2018-01-11 DIAGNOSIS — M19041 Primary osteoarthritis, right hand: Secondary | ICD-10-CM | POA: Diagnosis not present

## 2018-01-11 DIAGNOSIS — M19042 Primary osteoarthritis, left hand: Secondary | ICD-10-CM | POA: Insufficient documentation

## 2018-04-21 IMAGING — MG MM BREAST BX W/ LOC DEV EA AD LESION IMG BX SPEC STEREO GUIDE*R*
3 series · 4 of 7 positions shown · non-contrast
Comparison: Previous exams.

ADDENDUM:
Pathology revealed a complex sclerosing lesion and fibrocystic
changes with microcalcifications in the posterior right breast. The
anterior right breast showed an intraductal papilloma with
calcifications. Excision is recommended for both. This was found to
be concordant by Dr. Feitze Balder. Pathology results were discussed
with the patient by telephone. The patient reported doing well after
the biopsy with tenderness at the sites. Post biopsy instructions
and care were reviewed and questions were answered. The patient was
encouraged to call The [REDACTED] for any
additional concerns. The patient desires surgical consultation in
[place], has been notified and she will arrange consultation.
She will notify the patient of her appointment.

Pathology results reported by Pg Haji Seng RN, BSN on 12/18/2015.
CLINICAL DATA: Patient with suspicious right breast calcifications.
EXAM:
RIGHT BREAST STEREOTACTIC CORE NEEDLE BIOPSY

[R LM (1 of 2)]
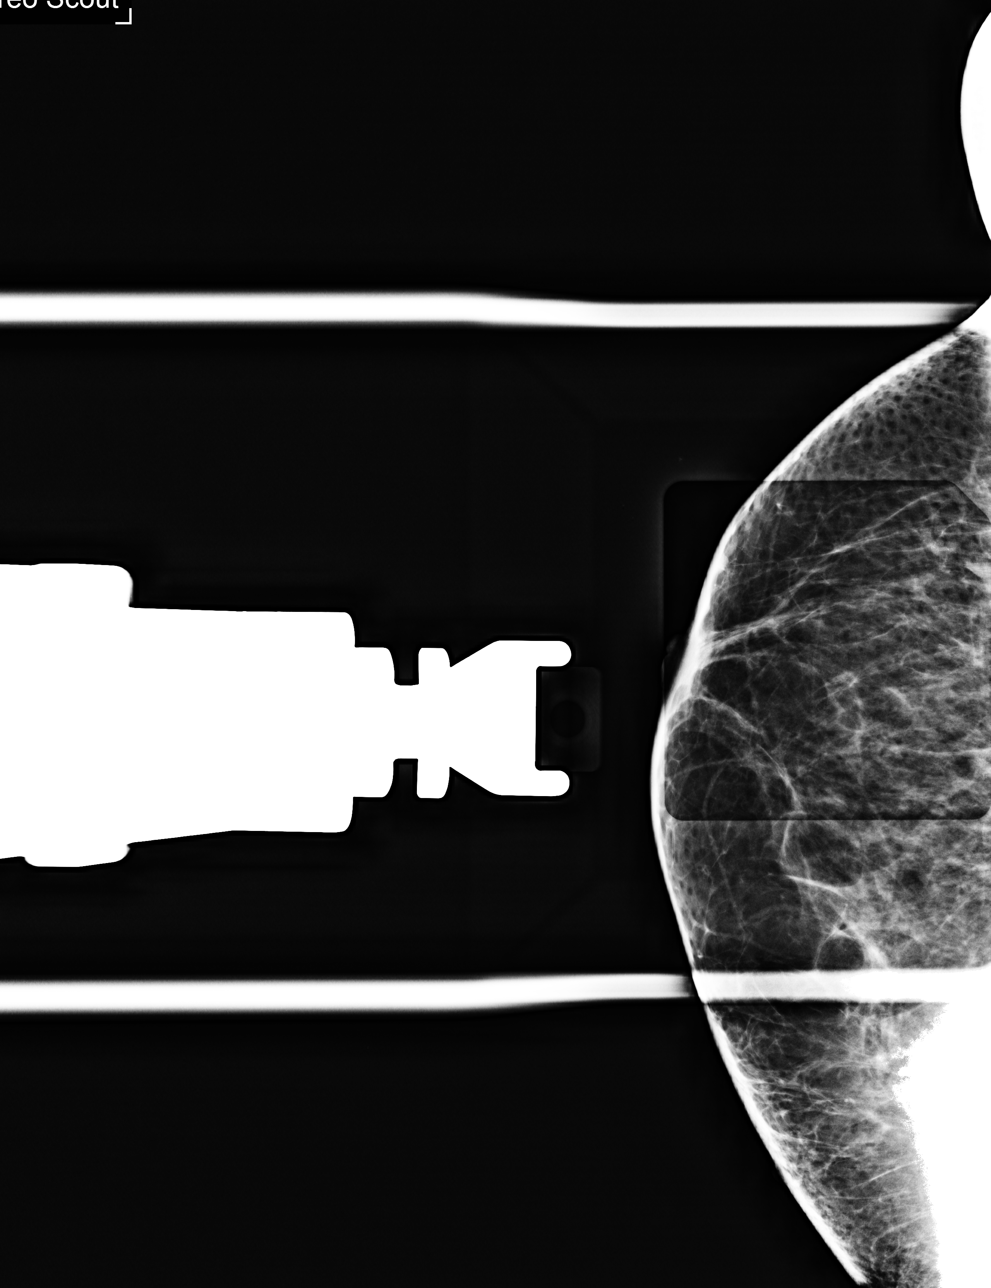

[R LM (2 of 2)]
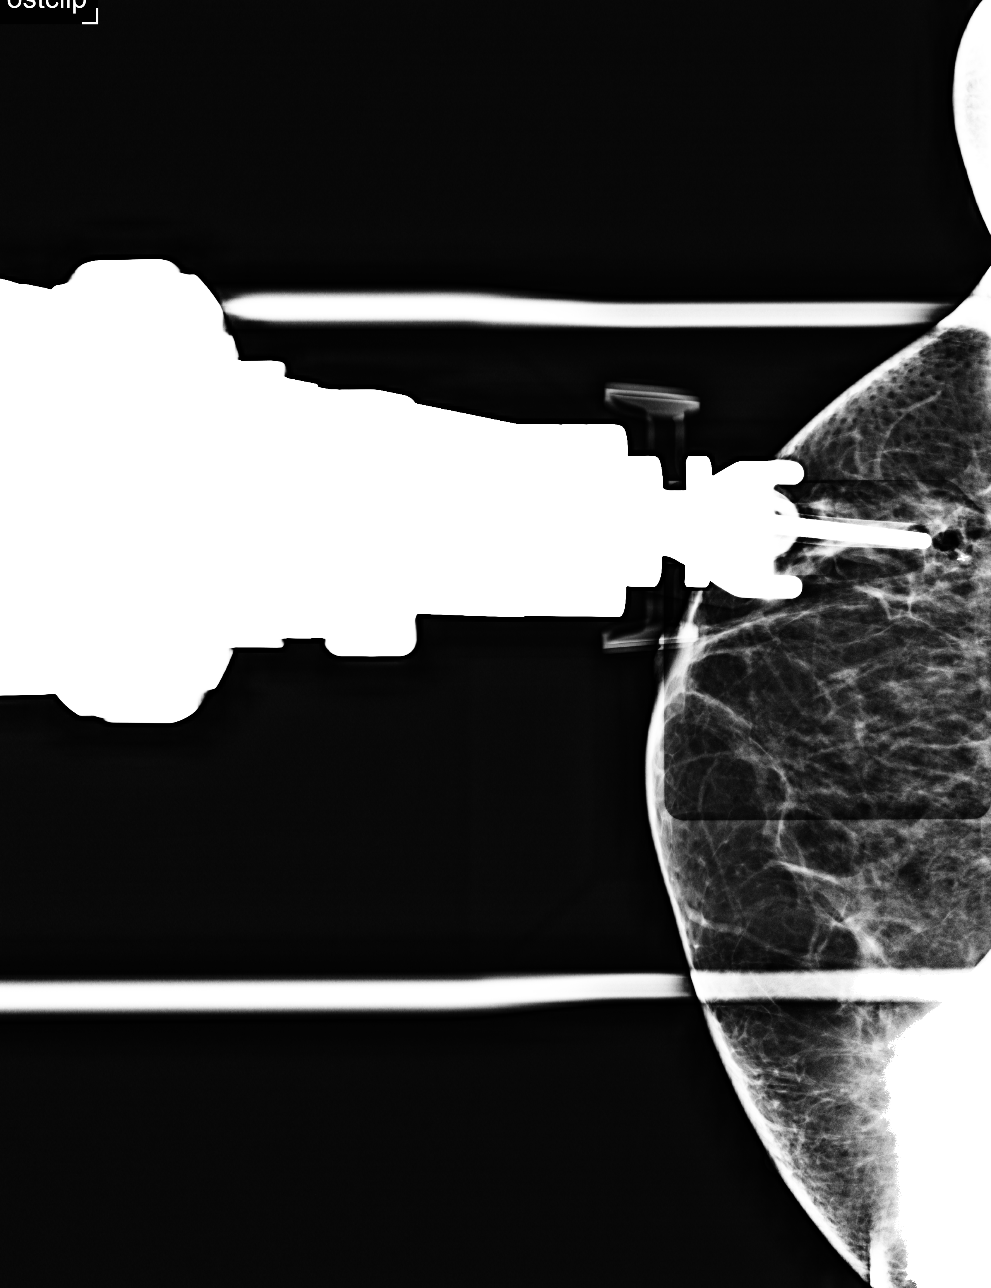

[R LM tomo · 2 of 63 frames shown]
[frame 21/63]
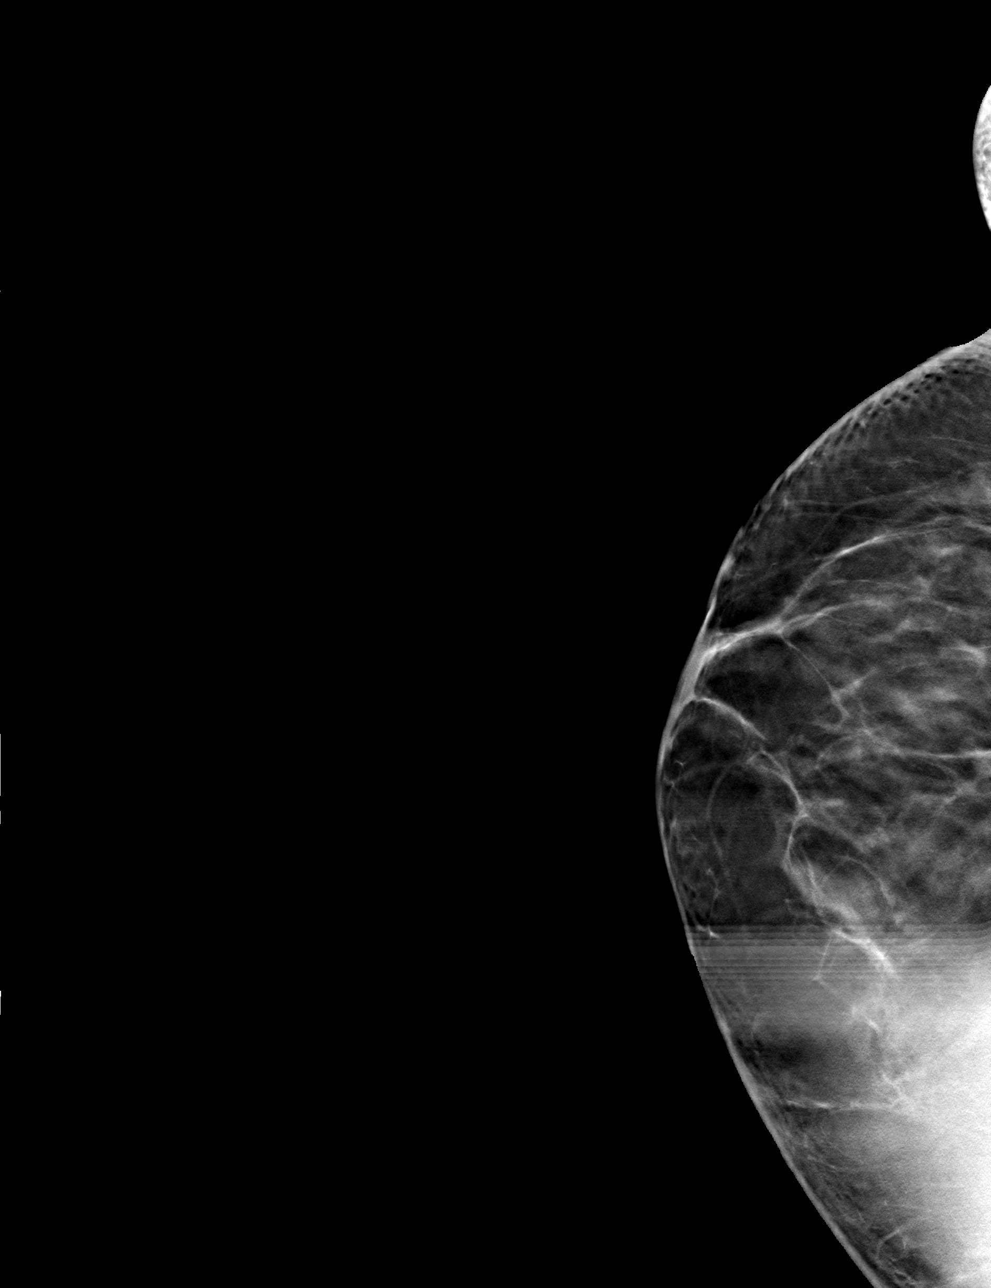
[frame 32/63]
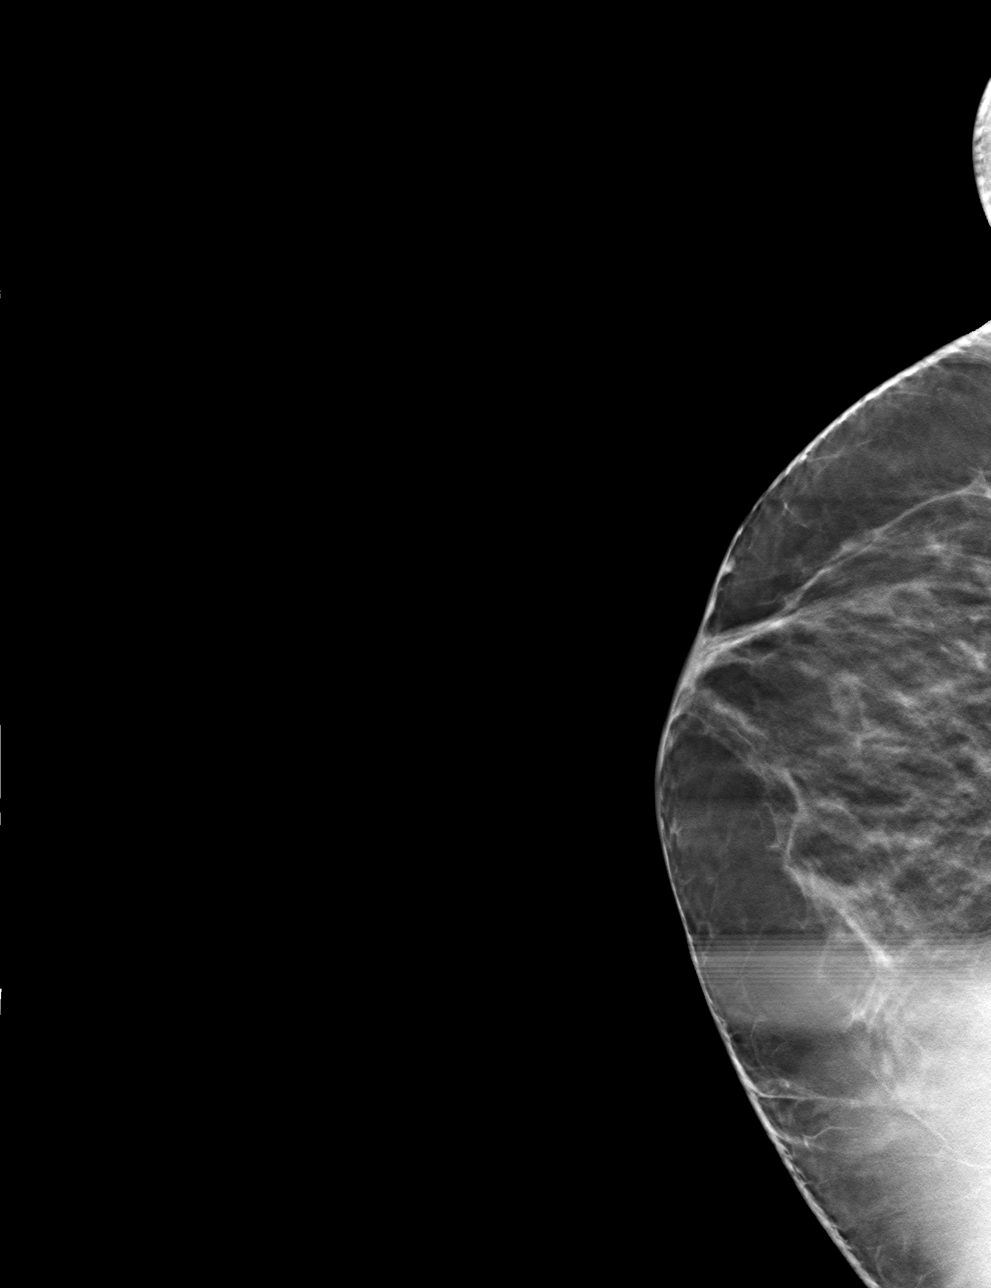

[4 of 7 positions shown; findings below may reference images not displayed]



Using sterile technique and 1% Lidocaine as local anesthetic, under
stereotactic guidance, a 9 gauge vacuum assisted core needle biopsy
device was used to perform core needle biopsy of calcifications
within the lower outer right breast using a lateral approach.
Specimen radiograph was performed showing calcifications. Specimens
with calcifications are identified for pathology.

At the conclusion of the procedure, a X shaped tissue marker clip
was deployed into the biopsy cavity. Follow-up 2-view mammogram was
performed and dictated separately.
IMPRESSION: Stereotactic-guided biopsy of right breast calcifications. No
apparent complications.

## 2018-06-01 IMAGING — MG MM BREAST SURGICAL SPECIMEN
1 series · 1 of 1 positions shown · non-contrast
Comparison: Previous exam(s).

CLINICAL DATA: Biopsy proven complex sclerosing lesion and
intraductal papilloma. Status post surgical excision.

EXAM:
SPECIMEN RADIOGRAPH OF THE RIGHT BREAST

[R SPECIMEN]
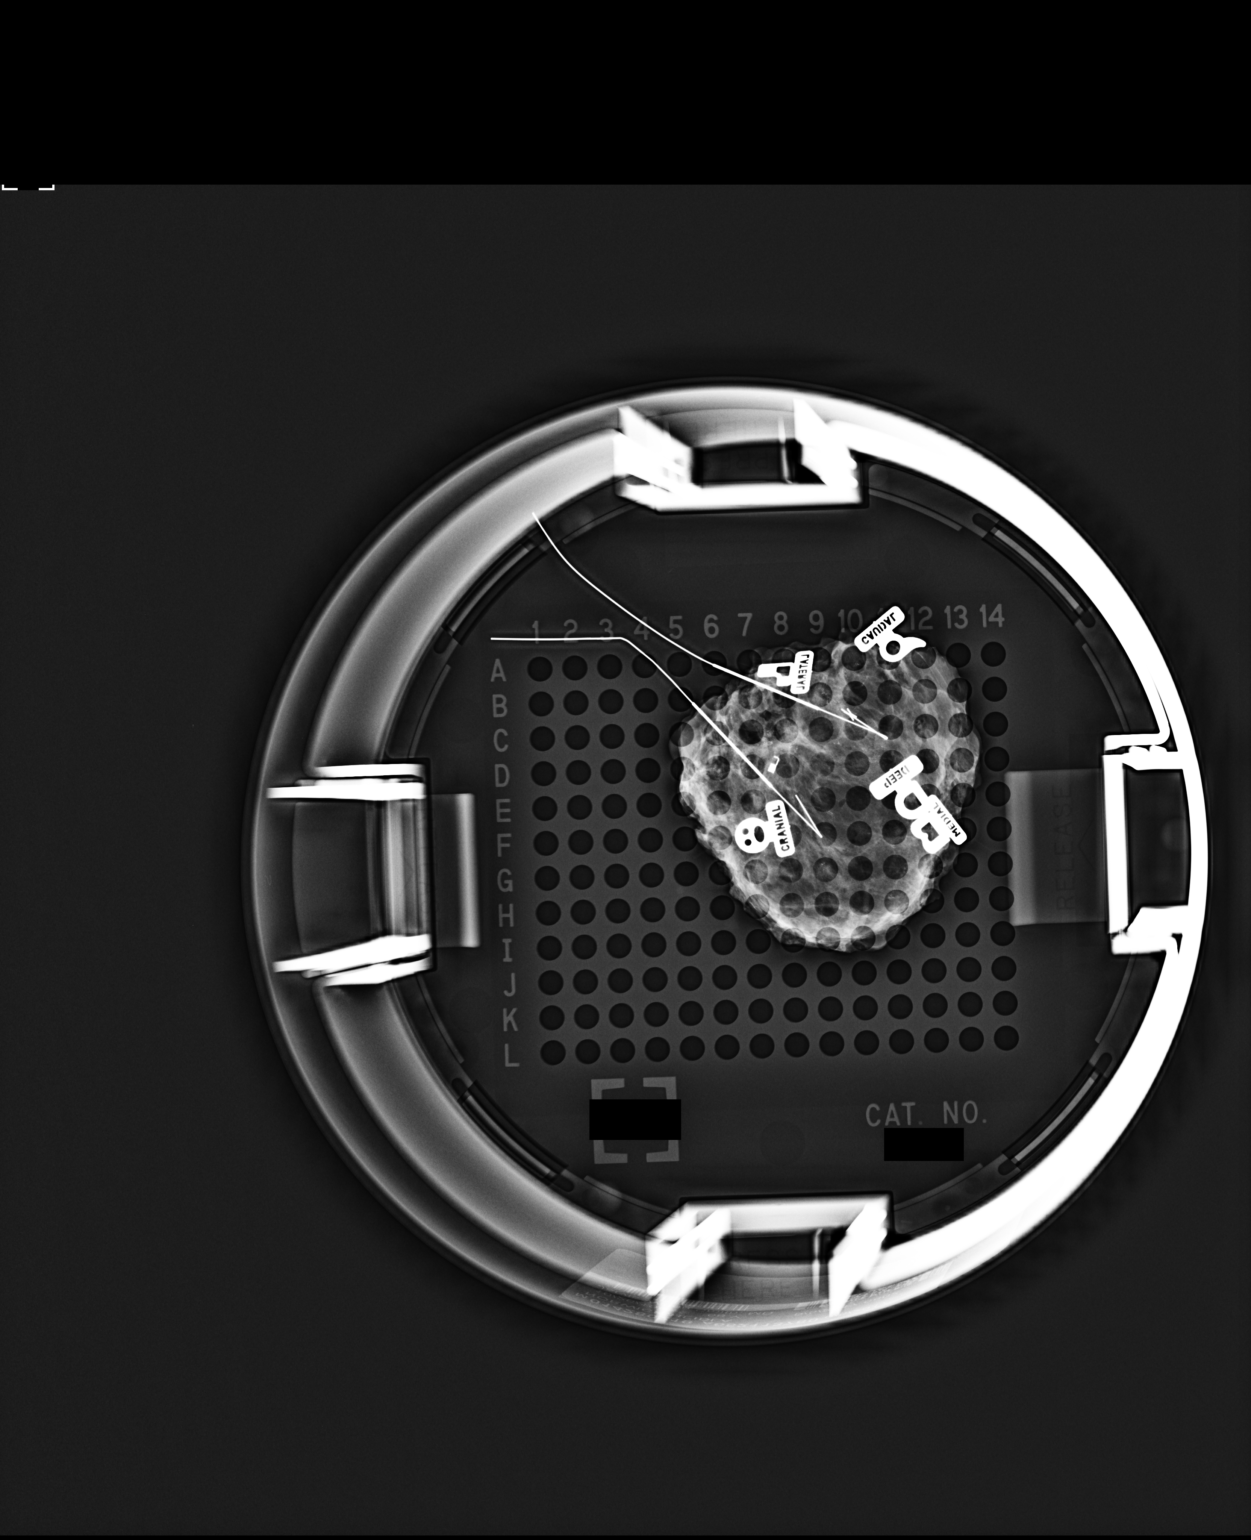

[1 of 1 positions shown; findings below may reference images not displayed]

FINDINGS: Status post excision of the right breast. The wire tips and biopsy
marker clips are present and are marked for pathology.
IMPRESSION: Specimen radiograph of the right breast.

## 2018-06-02 LAB — HM COLONOSCOPY

## 2018-11-22 ENCOUNTER — Other Ambulatory Visit: Payer: Self-pay | Admitting: *Deleted

## 2018-11-22 DIAGNOSIS — Z1231 Encounter for screening mammogram for malignant neoplasm of breast: Secondary | ICD-10-CM

## 2019-01-01 ENCOUNTER — Other Ambulatory Visit: Payer: Self-pay

## 2019-01-01 ENCOUNTER — Ambulatory Visit
Admission: RE | Admit: 2019-01-01 | Discharge: 2019-01-01 | Disposition: A | Discharge status: Home / Self Care | Payer: BC Managed Care – PPO | Source: Ambulatory Visit | Attending: *Deleted | Admitting: *Deleted

## 2019-01-01 DIAGNOSIS — Z1231 Encounter for screening mammogram for malignant neoplasm of breast: Secondary | ICD-10-CM | POA: Diagnosis not present

## 2019-08-10 ENCOUNTER — Ambulatory Visit: Payer: BC Managed Care – PPO | Attending: Internal Medicine

## 2019-08-10 DIAGNOSIS — Z23 Encounter for immunization: Secondary | ICD-10-CM

## 2019-08-31 ENCOUNTER — Ambulatory Visit: Payer: BC Managed Care – PPO | Attending: Internal Medicine

## 2019-08-31 DIAGNOSIS — Z23 Encounter for immunization: Secondary | ICD-10-CM

## 2019-08-31 NOTE — Progress Notes (Signed)
   Covid-19 Vaccination Clinic  Name:  Vanessa Austin    MRN: [id number] DOB: 1955/10/16  08/31/2019  Vanessa Austin was observed post Covid-19 immunization for 15 minutes without incident. She was provided with Vaccine Information Sheet and instruction to access the V-Safe system.   Vanessa Austin was instructed to call 911 with any severe reactions post vaccine: Marland Kitchen Difficulty breathing  . Swelling of face and throat  . A fast heartbeat  . A bad rash all over body  . Dizziness and weakness   Immunizations Administered    Name Date Dose VIS Date Route   Pfizer COVID-19 Vaccine 08/31/2019 12:55 PM 0.3 mL 05/04/2019 Intramuscular   Manufacturer: [place]   Lot: [id number]   [place]: [id number]

## 2020-01-31 ENCOUNTER — Other Ambulatory Visit: Payer: Self-pay | Admitting: *Deleted

## 2020-01-31 DIAGNOSIS — Z1231 Encounter for screening mammogram for malignant neoplasm of breast: Secondary | ICD-10-CM

## 2020-02-08 ENCOUNTER — Ambulatory Visit
Admission: RE | Admit: 2020-02-08 | Discharge: 2020-02-08 | Disposition: A | Discharge status: Home / Self Care | Payer: BC Managed Care – PPO | Source: Ambulatory Visit

## 2020-02-08 DIAGNOSIS — Z1231 Encounter for screening mammogram for malignant neoplasm of breast: Secondary | ICD-10-CM

## 2021-01-21 ENCOUNTER — Other Ambulatory Visit: Payer: Self-pay | Admitting: *Deleted

## 2021-01-21 DIAGNOSIS — Z1231 Encounter for screening mammogram for malignant neoplasm of breast: Secondary | ICD-10-CM

## 2021-02-10 ENCOUNTER — Ambulatory Visit
Admission: RE | Admit: 2021-02-10 | Discharge: 2021-02-10 | Disposition: A | Discharge status: Home / Self Care | Payer: BC Managed Care – PPO | Source: Ambulatory Visit | Attending: *Deleted | Admitting: *Deleted

## 2021-02-10 ENCOUNTER — Other Ambulatory Visit: Payer: Self-pay

## 2021-02-10 DIAGNOSIS — Z1231 Encounter for screening mammogram for malignant neoplasm of breast: Secondary | ICD-10-CM | POA: Insufficient documentation

## 2021-04-06 ENCOUNTER — Encounter: Payer: Self-pay | Admitting: Emergency Medicine

## 2021-04-06 ENCOUNTER — Other Ambulatory Visit: Payer: Self-pay

## 2021-04-06 ENCOUNTER — Ambulatory Visit
Admission: EM | Admit: 2021-04-06 | Discharge: 2021-04-06 | Disposition: A | Discharge status: Home / Self Care | Payer: BC Managed Care – PPO | Attending: Emergency Medicine | Admitting: Emergency Medicine

## 2021-04-06 DIAGNOSIS — R1030 Lower abdominal pain, unspecified: Secondary | ICD-10-CM | POA: Diagnosis present

## 2021-04-06 DIAGNOSIS — N390 Urinary tract infection, site not specified: Secondary | ICD-10-CM | POA: Diagnosis not present

## 2021-04-06 DIAGNOSIS — R319 Hematuria, unspecified: Secondary | ICD-10-CM | POA: Diagnosis present

## 2021-04-06 LAB — POCT URINALYSIS DIP (MANUAL ENTRY)
Bilirubin, UA: NEGATIVE
Glucose, UA: 100 mg/dL — AB
Ketones, POC UA: NEGATIVE mg/dL
Nitrite, UA: POSITIVE — AB
Protein Ur, POC: 30 mg/dL — AB
Spec Grav, UA: 1.01 (ref 1.010–1.025)
Urobilinogen, UA: 1 E.U./dL
pH, UA: 5.5 (ref 5.0–8.0)

## 2021-04-06 MED ORDER — NITROFURANTOIN MONOHYD MACRO 100 MG PO CAPS: 100.0000 mg | ORAL_CAPSULE | Freq: Two times a day (BID) | ORAL | 0 refills | Status: DC

## 2021-04-06 NOTE — ED Provider Notes (Signed)
[name]    CSN: [id number] Arrival date & time: 04/06/21  1513      History   Chief Complaint Chief Complaint  Patient presents with   Abdominal Pain    HPI Vanessa Austin is a 65 y.o. female.  Patient presents with lower abdominal pain x1 day.  The pain is in her right lower abdomen and suprapubic region.  She denies fever, vomiting, diarrhea, constipation, flank pain, dysuria, hematuria, vaginal discharge, pelvic pain, or other symptoms.  Treatment attempted at home with Azo because patient thought it may be a bladder infection.  Her medical history includes hypertension, hypothyroidism, allergies.  The history is provided by the patient and medical records.   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 Leanor Kail   BREAST EXCISIONAL BIOPSY Right 01/27/2016   papilloma and complex sclerosing lesion removed   BREAST LUMPECTOMY WITH NEEDLE LOCALIZATION Right 01/27/2016   Procedure: BREAST LUMPECTOMY WITH NEEDLE LOCALIZATION;  Surgeon: Leonie Green, MD;  Location: [facility] ORS;  Service: General;  Laterality: Right;    OB History     Gravida  3   Para  3   Term      Preterm      AB      Living         SAB      IAB      Ectopic      Multiple      Live Births               Home Medications    Prior to Admission medications    Medication Sig Start Date End Date Taking? Authorizing Provider  hydrochlorothiazide (HYDRODIURIL) 12.5 MG tablet Take 12.5 mg by mouth daily. 02/02/21  Yes [provider]  levothyroxine (SYNTHROID, LEVOTHROID) 125 MCG tablet Take 1 tablet (125 mcg total) by mouth daily. 11/30/16  Yes Mar Daring, PA-C  liothyronine (CYTOMEL) 5 MCG tablet Take 5 mcg by mouth daily. 02/07/21  Yes [provider]  lisinopril (PRINIVIL,ZESTRIL) 5 MG tablet Take 1 tablet (5 mg total) by mouth daily. 01/25/17  Yes Mar Daring, PA-C  nitrofurantoin, macrocrystal-monohydrate, (MACROBID) 100 MG capsule Take 1 capsule (100 mg total) by mouth 2 (two) times daily. 04/06/21  Yes Sharion Balloon, NP  Cetirizine HCl 10 MG CAPS Take 10 mg by mouth daily.    [provider]  Cholecalciferol 10000 UNITS CAPS Take by mouth daily.    [provider]  fluticasone (FLONASE) 50 MCG/ACT nasal spray Place 2 sprays into the nose daily.    [provider]    Family History Family History  Problem Relation Age of Onset   Hypertension Mother    Brain cancer Mother    Hypertension Father    Heart disease Father    Hypertension Brother    Breast cancer  Neg Hx     Social History Social History   Tobacco Use   Smoking status: Former    Packs/day: 0.25    Years: 10.00    Pack years: 2.50    Types: Cigarettes    Quit date: 05/23/1980    Years since quitting: 40.8   Smokeless tobacco: Never  Vaping Use   Vaping Use: Never used  Substance Use Topics   Alcohol use: Yes    Alcohol/week: 7.0 standard drinks    Types: 7 Glasses of wine per week   Drug use: No     Allergies   Etodolac, Penicillins, Sulfa antibiotics, and Meloxicam   Review of Systems Review of Systems  Constitutional:  Negative for chills and fever.  Respiratory:  Negative for cough and shortness of breath.   Cardiovascular:  Negative for chest pain and palpitations.  Gastrointestinal:  Positive  for abdominal pain. Negative for blood in stool, constipation, diarrhea and vomiting.  Genitourinary:  Negative for dysuria, flank pain, hematuria, pelvic pain and vaginal discharge.  Skin:  Negative for color change and rash.  All other systems reviewed and are negative.   Physical Exam Triage Vital Signs ED Triage Vitals  Enc Vitals Group     BP 04/06/21 1555 (!) 147/87     Pulse Rate 04/06/21 1555 76     Resp --      Temp 04/06/21 1555 98.1 F (36.7 C)     Temp Source 04/06/21 1555 Oral     SpO2 04/06/21 1555 95 %     Weight --      Height --      Head Circumference --      Peak Flow --      Pain Score 04/06/21 1556 5     Pain Loc --      Pain Edu? --      Excl. in [place]? --    No data found.  Updated Vital Signs BP (!) 147/87 (BP Location: Left Arm)   Pulse 76   Temp 98.1 F (36.7 C) (Oral)   SpO2 95%   Visual Acuity Right Eye Distance:   Left Eye Distance:   Bilateral Distance:    Right Eye Near:   Left Eye Near:    Bilateral Near:     Physical Exam Vitals and nursing note reviewed.  Constitutional:      General: She is not in acute distress.    Appearance: She is well-developed. She is not ill-appearing.  HENT:     Head: Normocephalic and atraumatic.     Mouth/Throat:     Mouth: Mucous membranes are moist.  Eyes:     Conjunctiva/sclera: Conjunctivae normal.  Cardiovascular:     Rate and Rhythm: Normal rate and regular rhythm.     Heart sounds: Normal heart sounds.  Pulmonary:     Effort: Pulmonary effort is normal. No respiratory distress.     Breath sounds: Normal breath sounds.  Abdominal:     General: Bowel sounds are normal.     Palpations: Abdomen is soft.     Tenderness: There is no abdominal tenderness. There is no right CVA tenderness, left CVA tenderness, guarding or rebound.  Musculoskeletal:     Cervical back: Neck supple.  Skin:    General: Skin is warm and dry.  Neurological:     Mental Status: She is alert.  Psychiatric:         Mood and Affect: Mood normal.  Behavior: Behavior normal.     UC Treatments / Results  Labs (all labs ordered are listed, but only abnormal results are displayed) Labs Reviewed  POCT URINALYSIS DIP (MANUAL ENTRY) - Abnormal; Notable for the following components:      Result Value   Color, UA orange (*)    Glucose, UA =100 (*)    Blood, UA small (*)    Protein Ur, POC =30 (*)    Nitrite, UA Positive (*)    Leukocytes, UA Trace (*)    All other components within normal limits  URINE CULTURE    EKG   Radiology No results found.  Procedures Procedures (including critical care time)  Medications Ordered in UC Medications - No data to display  Initial Impression / Assessment and Plan / UC Course  I have reviewed the triage vital signs and the nursing notes.  Pertinent labs & imaging results that were available during my care of the patient were reviewed by me and considered in my medical decision making (see chart for details).  UTI with hematuria, lower abdominal pain.   Abdomen is soft and nontender.  Patient is afebrile and vital signs are stable.  Treating with Macrobid. Urine culture pending. Discussed with patient that we will call her if the urine culture shows the need to change or discontinue the antibiotic. Instructed her to follow-up with her PCP if her symptoms are not improving. Patient agrees to plan of care.        Final Clinical Impressions(s) / UC Diagnoses   Final diagnoses:  Urinary tract infection with hematuria, site unspecified  Lower abdominal pain     Discharge Instructions      Take the antibiotic as directed.  The urine culture is pending.  We will call you if it shows the need to change or discontinue your antibiotic.   Follow up with your primary care provider if your symptoms are not improving.    Go to the emergency department if you have acute abdominal pain or other concerning symptoms.         ED Prescriptions      Medication Sig Dispense Auth. Provider   nitrofurantoin, macrocrystal-monohydrate, (MACROBID) 100 MG capsule Take 1 capsule (100 mg total) by mouth 2 (two) times daily. 10 capsule Sharion Balloon, NP      PDMP not reviewed this encounter.   Sharion Balloon, NP 04/06/21 1623

## 2021-04-06 NOTE — ED Triage Notes (Signed)
Pt presents with right side groin pain sxs started yesterday. Pt denies N/V or diarrhea. She describes as a dull ache and started AZO today due to sxs.

## 2021-04-06 NOTE — Discharge Instructions (Addendum)
Take the antibiotic as directed.  The urine culture is pending.  We will call you if it shows the need to change or discontinue your antibiotic.   Follow up with your primary care provider if your symptoms are not improving.    Go to the emergency department if you have acute abdominal pain or other concerning symptoms.

## 2021-04-09 ENCOUNTER — Telehealth (HOSPITAL_COMMUNITY): Payer: Self-pay | Admitting: Emergency Medicine

## 2021-04-09 LAB — URINE CULTURE: Culture: >=100000 — AB

## 2021-04-09 MED ORDER — CEPHALEXIN 500 MG PO CAPS: 500.0000 mg | ORAL_CAPSULE | Freq: Two times a day (BID) | ORAL | 0 refills | Status: AC

## 2021-04-09 NOTE — Telephone Encounter (Signed)
Patient called after picking up prescription and is concerned about possible cross-reaction with Keflex.  She is requesting something else be sent.  She is allergic to sulfa, so I am unable to cover with protocols.  Awaiting provider review

## 2021-04-10 NOTE — Telephone Encounter (Signed)
Per Claiborne Billings, APP encourage patient to take Keflex.  Reviewed with patient, she states she filled it but is going to hold off as she is feeling no symptoms at this time.  No questions at this time

## 2021-05-14 ENCOUNTER — Ambulatory Visit (INDEPENDENT_AMBULATORY_CARE_PROVIDER_SITE_OTHER): Payer: BC Managed Care – PPO

## 2021-05-14 ENCOUNTER — Other Ambulatory Visit: Payer: Self-pay

## 2021-05-14 ENCOUNTER — Ambulatory Visit
Admission: EM | Admit: 2021-05-14 | Discharge: 2021-05-14 | Disposition: A | Discharge status: Home / Self Care | Payer: BC Managed Care – PPO | Attending: Physician Assistant | Admitting: Physician Assistant

## 2021-05-14 ENCOUNTER — Encounter: Payer: Self-pay | Admitting: Emergency Medicine

## 2021-05-14 DIAGNOSIS — J209 Acute bronchitis, unspecified: Secondary | ICD-10-CM

## 2021-05-14 DIAGNOSIS — J329 Chronic sinusitis, unspecified: Secondary | ICD-10-CM | POA: Diagnosis not present

## 2021-05-14 DIAGNOSIS — R059 Cough, unspecified: Secondary | ICD-10-CM

## 2021-05-14 DIAGNOSIS — R051 Acute cough: Secondary | ICD-10-CM | POA: Diagnosis not present

## 2021-05-14 DIAGNOSIS — J40 Bronchitis, not specified as acute or chronic: Secondary | ICD-10-CM

## 2021-05-14 MED ORDER — DOXYCYCLINE HYCLATE 100 MG PO CAPS: 100.0000 mg | ORAL_CAPSULE | Freq: Two times a day (BID) | ORAL | 0 refills | Status: DC

## 2021-05-14 MED ORDER — PREDNISONE 20 MG PO TABS: 40.0000 mg | ORAL_TABLET | Freq: Every day | ORAL | 0 refills | Status: AC

## 2021-05-14 NOTE — Discharge Instructions (Signed)
Your chest x-ray did not show any evidence of pneumonia.  It did show some bronchial thickening which could be related to bronchitis or chronic changes.  We are going to start doxycycline 100 mg twice daily.  Stay out of the sun while on this medication.  Would also like you to take a prednisone burst (40 mg x 4 days).  You should not take NSAIDs including aspirin, ibuprofen/Advil, naproxen/Aleve with this medication due to risk of GI bleeding.  Use Tylenol, Mucinex, Flonase for symptom relief.  If you develop any worsening symptoms including shortness of breath, chest pain, worsening cough, high fever or responding to medication, nausea/vomiting interfering with oral intake need to be seen immediately.  If symptoms or not improving with medication regimen within a week please return to our clinic or see PCP for reevaluation.

## 2021-05-14 NOTE — ED Triage Notes (Signed)
Pt was dx with Covid on the 4th. She has a dry cough and tickle in her throat she feels like something is in her chest.

## 2021-05-14 NOTE — ED Provider Notes (Signed)
[name]    CSN: [id number] Arrival date & time: 05/14/21  7672      History   Chief Complaint Chief Complaint  Patient presents with   Cough    HPI Vanessa Austin is a 65 y.o. female.   Patient presents today with a several week history of persistent cough.  Reports associated nasal congestion and drainage in the posterior portion of her oropharynx.  She denies any shortness of breath, chest pain, fever, nausea, vomiting, body aches.  She was diagnosed with COVID-19 after traveling on 04/26/2021 and initially had improvement of symptoms until worsening recently.  She denies history of lung condition including asthma or allergies.  She smoked very briefly in her teenage years but denies any significant tobacco use.  She denies any antibiotic use.  Denies history of diabetes.  She has not been trying any over-the-counter medication for symptom management.  She reports cough is bothersome and interferes with ability perform daily activities.   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 Leanor Kail   BREAST EXCISIONAL BIOPSY Right 01/27/2016   papilloma and complex sclerosing lesion removed   BREAST LUMPECTOMY WITH NEEDLE LOCALIZATION Right 01/27/2016   Procedure: BREAST LUMPECTOMY WITH NEEDLE LOCALIZATION;  Surgeon: Leonie Green, MD;  Location: [facility] ORS;   Service: General;  Laterality: Right;    OB History     Gravida  3   Para  3   Term      Preterm      AB      Living         SAB      IAB      Ectopic      Multiple      Live Births               Home Medications    Prior to Admission medications   Medication Sig Start Date End Date Taking? Authorizing Provider  doxycycline (VIBRAMYCIN) 100 MG capsule Take 1 capsule (100 mg total) by mouth 2 (two) times daily. 05/14/21  Yes Linea Calles K, PA-C  predniSONE (DELTASONE) 20 MG tablet Take 2 tablets (40 mg total) by mouth daily for 4 days. 05/14/21 05/18/21 Yes Luc Shammas, Derry Skill, PA-C  Cetirizine HCl 10 MG CAPS Take 10 mg by mouth daily.    [provider]  Cholecalciferol 10000 UNITS CAPS Take by mouth daily.    [provider]  fluticasone (FLONASE) 50 MCG/ACT nasal spray Place 2 sprays into the nose daily.    [provider]  hydrochlorothiazide (HYDRODIURIL) 12.5 MG tablet Take 12.5 mg by mouth daily. 02/02/21   [provider]  levothyroxine (SYNTHROID, LEVOTHROID) 125 MCG tablet Take 1 tablet (125 mcg total) by mouth daily. 11/30/16   Mar Daring, PA-C  liothyronine (CYTOMEL) 5 MCG tablet Take 5 mcg by mouth daily. 02/07/21  [provider]  lisinopril (PRINIVIL,ZESTRIL) 5 MG tablet Take 1 tablet (5 mg total) by mouth daily. 01/25/17   Mar Daring, PA-C    Family History Family History  Problem Relation Age of Onset   Hypertension Mother    Brain cancer Mother    Hypertension Father    Heart disease Father    Hypertension Brother    Breast cancer Neg Hx     Social History Social History   Tobacco Use   Smoking status: Former    Packs/day: 0.25    Years: 10.00    Pack years: 2.50    Types: Cigarettes    Quit date: 05/23/1980    Years since quitting: 41.0   Smokeless tobacco: Never  Vaping Use   Vaping Use: Never used  Substance Use Topics   Alcohol use: Yes    Alcohol/week: 7.0  standard drinks    Types: 7 Glasses of wine per week   Drug use: No     Allergies   Etodolac, Penicillins, Sulfa antibiotics, and Meloxicam   Review of Systems Review of Systems  Constitutional:  Positive for activity change and fatigue. Negative for appetite change and fever.  HENT:  Positive for postnasal drip. Negative for congestion, sinus pressure, sneezing and sore throat.   Respiratory:  Positive for cough. Negative for chest tightness, shortness of breath and wheezing.   Cardiovascular:  Negative for chest pain.  Gastrointestinal:  Negative for abdominal pain, diarrhea, nausea and vomiting.  Musculoskeletal:  Negative for arthralgias and myalgias.  Neurological:  Positive for headaches. Negative for dizziness and light-headedness.    Physical Exam Triage Vital Signs ED Triage Vitals [05/14/21 1008]  Enc Vitals Group     BP 139/88     Pulse Rate 79     Resp 18     Temp 98.2 F (36.8 C)     Temp Source Oral     SpO2 95 %     Weight      Height      Head Circumference      Peak Flow      Pain Score      Pain Loc      Pain Edu?      Excl. in [place]?    No data found.  Updated Vital Signs BP 139/88 (BP Location: Left Arm)    Pulse 79    Temp 98.2 F (36.8 C) (Oral)    Resp 18    SpO2 95%   Visual Acuity Right Eye Distance:   Left Eye Distance:   Bilateral Distance:    Right Eye Near:   Left Eye Near:    Bilateral Near:     Physical Exam Vitals reviewed.  Constitutional:      General: She is awake. She is not in acute distress.    Appearance: Normal appearance. She is well-developed. She is not ill-appearing.     Comments: Very pleasant female appears stated age in no acute distress sitting comfortably in exam room table  HENT:     Head: Normocephalic and atraumatic.     Right Ear: Tympanic membrane, ear canal and external ear normal. Tympanic membrane is not erythematous or bulging.     Left Ear: Tympanic membrane, ear canal and external ear normal.  Tympanic membrane is not erythematous or bulging.     Nose:     Right Sinus: No maxillary sinus tenderness or frontal sinus tenderness.     Left Sinus: No maxillary sinus tenderness  or frontal sinus tenderness.     Mouth/Throat:     Pharynx: Uvula midline. Posterior oropharyngeal erythema present. No oropharyngeal exudate.  Cardiovascular:     Rate and Rhythm: Normal rate and regular rhythm.     Heart sounds: Normal heart sounds, S1 normal and S2 normal. No murmur heard. Pulmonary:     Effort: Pulmonary effort is normal.     Breath sounds: No wheezing, rhonchi or rales.  Psychiatric:        Behavior: Behavior is cooperative.     UC Treatments / Results  Labs (all labs ordered are listed, but only abnormal results are displayed) Labs Reviewed - No data to display  EKG   Radiology DG Chest 2 View  Result Date: 05/14/2021 CLINICAL DATA:  Cough EXAM: CHEST - 2 VIEW COMPARISON:  None. FINDINGS: Heart size is normal. Mediastinum appears within normal limits. No focal consolidation identified in the lungs. Mildly prominent interstitial lung markings, upper lobe predominant with biapical pleural thickening. No pleural effusion or pneumothorax. IMPRESSION: No focal consolidation identified.  Likely COPD changes. Electronically Signed   By: Ofilia Neas M.D.   On: 05/14/2021 10:47    Procedures Procedures (including critical care time)  Medications Ordered in UC Medications - No data to display  Initial Impression / Assessment and Plan / UC Course  I have reviewed the triage vital signs and the nursing notes.  Pertinent labs & imaging results that were available during my care of the patient were reviewed by me and considered in my medical decision making (see chart for details).     Vital signs and physical exam reassuring today; no indication for emergent evaluation.  Chest x-ray obtained given prolonged history of cough showed no focal consolidation but did show interstitial  lung markings.  Given prolonged and worsening symptoms will cover with doxycycline 100 mg.  Patient was instructed to avoid prolonged sun exposure due to photosensitivity associated with this medication.  She was given prednisone burst with instruction not to take NSAIDs including aspirin, ibuprofen/Advil, naproxen/Aleve due to risk of GI bleeding.  Can use Tylenol, Mucinex, Flonase for additional symptom relief.  She was encouraged to rest and drink plenty of fluid.  Discussed that if she has any worsening symptoms including high fever not responding to medication, chest pain, shortness of breath, nausea/vomiting interfering with oral intake she needs to go to the emergency room.  If symptoms have not improved with medication regimen within a week she should be reevaluated by either our clinic or PCP.  Strict return precautions given to which she expressed understanding.  Final Clinical Impressions(s) / UC Diagnoses   Final diagnoses:  Acute bronchitis, unspecified organism  Sinobronchitis  Acute cough     Discharge Instructions      Your chest x-ray did not show any evidence of pneumonia.  It did show some bronchial thickening which could be related to bronchitis or chronic changes.  We are going to start doxycycline 100 mg twice daily.  Stay out of the sun while on this medication.  Would also like you to take a prednisone burst (40 mg x 4 days).  You should not take NSAIDs including aspirin, ibuprofen/Advil, naproxen/Aleve with this medication due to risk of GI bleeding.  Use Tylenol, Mucinex, Flonase for symptom relief.  If you develop any worsening symptoms including shortness of breath, chest pain, worsening cough, high fever or responding to medication, nausea/vomiting interfering with oral intake need to be seen immediately.  If symptoms or not  improving with medication regimen within a week please return to our clinic or see PCP for reevaluation.     ED Prescriptions     Medication Sig  Dispense Auth. Provider   predniSONE (DELTASONE) 20 MG tablet Take 2 tablets (40 mg total) by mouth daily for 4 days. 8 tablet Amberlee Garvey K, PA-C   doxycycline (VIBRAMYCIN) 100 MG capsule Take 1 capsule (100 mg total) by mouth 2 (two) times daily. 20 capsule Marilin Kofman, Derry Skill, PA-C      PDMP not reviewed this encounter.   Terrilee Croak, PA-C 05/14/21 1102

## 2021-07-11 ENCOUNTER — Encounter: Payer: Self-pay | Admitting: Emergency Medicine

## 2021-07-11 ENCOUNTER — Ambulatory Visit
Admission: EM | Admit: 2021-07-11 | Discharge: 2021-07-11 | Disposition: A | Discharge status: Home / Self Care | Payer: BC Managed Care – PPO | Attending: Family Medicine | Admitting: Family Medicine

## 2021-07-11 ENCOUNTER — Other Ambulatory Visit: Payer: Self-pay

## 2021-07-11 DIAGNOSIS — J683 Other acute and subacute respiratory conditions due to chemicals, gases, fumes and vapors: Secondary | ICD-10-CM

## 2021-07-11 MED ORDER — AEROCHAMBER PLUS FLO-VU LARGE MISC
1.0000 | Freq: Once | Status: AC
  Administered 2021-07-11: 1

## 2021-07-11 MED ORDER — ALBUTEROL SULFATE HFA 108 (90 BASE) MCG/ACT IN AERS
2.0000 | INHALATION_SPRAY | Freq: Four times a day (QID) | RESPIRATORY_TRACT | Status: DC | PRN
  Administered 2021-07-11: 2 via RESPIRATORY_TRACT

## 2021-07-11 MED ORDER — ALBUTEROL SULFATE HFA 108 (90 BASE) MCG/ACT IN AERS: 2.0000 | INHALATION_SPRAY | Freq: Four times a day (QID) | RESPIRATORY_TRACT | 0 refills | Status: DC | PRN

## 2021-07-11 MED ORDER — PREDNISONE 10 MG PO TABS: 10.0000 mg | ORAL_TABLET | Freq: Every day | ORAL | 0 refills | Status: AC

## 2021-07-11 NOTE — ED Triage Notes (Signed)
Pt c/o cough, chest congestion, and achy feeling started yesterday.

## 2021-07-11 NOTE — ED Provider Notes (Signed)
Vanessa Austin    CSN: [id number] Arrival date & time: 07/11/21  1003      History   Chief Complaint Chief Complaint  Patient presents with   Cough    HPI Vanessa Austin is a 66 y.o. female.   HPI Patient presents for evaluation of cough and chest congestion with wheezing x 1 day ago. She endorses wheezing and needs a refill of albuterol. She has attempted relief with OTC medication without relief. Endorses SOB and wheezing. Denies fever or known sick contacts.  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 Leanor Kail   BREAST EXCISIONAL BIOPSY Right 01/27/2016   papilloma and complex sclerosing lesion removed   BREAST LUMPECTOMY WITH NEEDLE LOCALIZATION Right 01/27/2016   Procedure: BREAST LUMPECTOMY WITH NEEDLE LOCALIZATION;  Surgeon: Leonie Green, MD;  Location: [facility] ORS;  Service: General;  Laterality: Right;    OB History     Gravida  3   Para  3   Term      Preterm      AB      Living         SAB      IAB      Ectopic      Multiple      Live Births               Home Medications    Prior to Admission medications   Medication Sig Start Date End Date Taking? Authorizing Provider  predniSONE (DELTASONE) 10 MG tablet Take 1 tablet (10 mg total) by mouth daily with breakfast for 5 days. 07/11/21 07/16/21 Yes Scot Jun, FNP  albuterol (VENTOLIN HFA) 108 (90 Base) MCG/ACT inhaler Inhale 2 puffs into the lungs every 6 (six) hours as needed for wheezing or shortness of breath (may use every 4-6 hours). 07/11/21   Scot Jun, FNP  Cetirizine HCl 10 MG CAPS Take 10 mg by mouth daily.    [provider]  Cholecalciferol 10000 UNITS CAPS Take by mouth daily.    [provider]  doxycycline (VIBRAMYCIN) 100 MG capsule Take 1 capsule (100 mg total) by mouth 2 (two) times daily. 05/14/21   Raspet, Derry Skill, PA-C  fluticasone (FLONASE) 50 MCG/ACT nasal spray Place 2 sprays into the nose daily.    [provider]  hydrochlorothiazide (HYDRODIURIL) 12.5 MG tablet Take 12.5 mg by mouth daily. 02/02/21   [provider]  levothyroxine (SYNTHROID, LEVOTHROID) 125 MCG tablet Take 1 tablet (125 mcg total) by mouth daily. 11/30/16   Mar Daring, PA-C  liothyronine (CYTOMEL) 5 MCG tablet Take 5 mcg by mouth daily. 02/07/21   [provider]  lisinopril (PRINIVIL,ZESTRIL) 5 MG tablet Take 1 tablet (5 mg total) by mouth daily. 01/25/17   Mar Daring, PA-C    Family History Family History  Problem  Relation Age of Onset   Hypertension Mother    Brain cancer Mother    Hypertension Father    Heart disease Father    Hypertension Brother    Breast cancer Neg Hx     Social History Social History   Tobacco Use   Smoking status: Former    Packs/day: 0.25    Years: 10.00    Pack years: 2.50    Types: Cigarettes    Quit date: 05/23/1980    Years since quitting: 41.1   Smokeless tobacco: Never  Vaping Use   Vaping Use: Never used  Substance Use Topics   Alcohol use: Yes    Alcohol/week: 7.0 standard drinks    Types: 7 Glasses of wine per week   Drug use: No     Allergies   Etodolac, Penicillins, Sulfa antibiotics, and Meloxicam   Review of Systems Review of Systems Pertinent negatives listed in HPI   Physical Exam Triage Vital Signs ED Triage  Vitals  Enc Vitals Group     BP 07/11/21 1116 (!) 159/93     Pulse Rate 07/11/21 1116 76     Resp 07/11/21 1116 18     Temp 07/11/21 1116 98.6 F (37 C)     Temp Source 07/11/21 1116 Oral     SpO2 07/11/21 1116 94 %     Weight --      Height --      Head Circumference --      Peak Flow --      Pain Score 07/11/21 1118 0     Pain Loc --      Pain Edu? --      Excl. in [place]? --    No data found.  Updated Vital Signs BP (!) 159/93 (BP Location: Right Arm)    Pulse 76    Temp 98.6 F (37 C) (Oral)    Resp 18    SpO2 94%   Visual Acuity Right Eye Distance:   Left Eye Distance:   Bilateral Distance:    Right Eye Near:   Left Eye Near:    Bilateral Near:     Physical Exam Constitutional:      Appearance: She is ill-appearing.  HENT:     Head: Normocephalic and atraumatic.     Right Ear: External ear normal.     Left Ear: External ear normal.     Nose: Nose normal.  Eyes:     Extraocular Movements: Extraocular movements intact.     Pupils: Pupils are equal, round, and reactive to light.  Cardiovascular:     Rate and Rhythm: Normal rate and regular rhythm.  Pulmonary:     Breath sounds: Wheezing present.  Musculoskeletal:        General: Normal range of motion.     Cervical back: Normal range of motion.  Lymphadenopathy:     Cervical: No cervical adenopathy.  Skin:    General: Skin is warm.     Capillary Refill: Capillary refill takes less than 2 seconds.  Neurological:     General: No focal deficit present.     Mental Status: She is alert and oriented to person, place, and time.     UC Treatments / Results  Labs (all labs ordered are listed, but only abnormal results are displayed) Labs Reviewed - No data to display  EKG   Radiology No results found.  Procedures Procedures (including critical care time)  Medications Ordered in UC Medications  AeroChamber Plus Flo-Vu  Large MISC 1 each (1 each Other Given 07/11/21 1212)    Initial Impression /  Assessment and Plan / UC Course  I have reviewed the triage vital signs and the nursing notes.  Pertinent labs & imaging results that were available during my care of the patient were reviewed by me and considered in my medical decision making (see chart for details).    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 Provider   predniSONE (DELTASONE) 10 MG tablet Take 1 tablet (10 mg total) by mouth daily with breakfast for 5 days. 5 tablet Scot Jun, FNP   albuterol (VENTOLIN HFA) 108 (90 Base) MCG/ACT inhaler  (Status: Discontinued) Inhale 2 puffs into the lungs every 6 (six) hours as needed for wheezing or shortness of breath (may use every 4-6 hours). 1 each Scot Jun, FNP   albuterol (VENTOLIN HFA) 108 (90 Base) MCG/ACT inhaler Inhale 2 puffs into the lungs every 6 (six) hours as needed for wheezing or shortness of breath (may use every 4-6 hours). 1 each Scot Jun, FNP      PDMP not reviewed this encounter.   Scot Jun, [place] 07/16/21 2044

## 2022-01-19 ENCOUNTER — Other Ambulatory Visit: Payer: Self-pay | Admitting: *Deleted

## 2022-01-19 DIAGNOSIS — Z1231 Encounter for screening mammogram for malignant neoplasm of breast: Secondary | ICD-10-CM

## 2022-02-11 ENCOUNTER — Ambulatory Visit
Admission: RE | Admit: 2022-02-11 | Discharge: 2022-02-11 | Disposition: A | Discharge status: Home / Self Care | Payer: BC Managed Care – PPO | Source: Ambulatory Visit | Attending: *Deleted | Admitting: *Deleted

## 2022-02-11 DIAGNOSIS — Z1231 Encounter for screening mammogram for malignant neoplasm of breast: Secondary | ICD-10-CM | POA: Insufficient documentation

## 2022-02-11 LAB — HM MAMMOGRAPHY

## 2022-02-16 LAB — TSH: TSH: 0.11 — AB (ref 0.41–5.90)

## 2022-07-02 ENCOUNTER — Other Ambulatory Visit: Payer: Self-pay | Admitting: *Deleted

## 2022-07-02 DIAGNOSIS — Z13820 Encounter for screening for osteoporosis: Secondary | ICD-10-CM

## 2022-08-18 ENCOUNTER — Ambulatory Visit
Admission: RE | Admit: 2022-08-18 | Discharge: 2022-08-18 | Disposition: A | Discharge status: Home / Self Care | Payer: BC Managed Care – PPO | Source: Ambulatory Visit | Attending: *Deleted | Admitting: *Deleted

## 2022-08-18 DIAGNOSIS — Z13820 Encounter for screening for osteoporosis: Secondary | ICD-10-CM | POA: Diagnosis not present

## 2022-08-18 LAB — HM DEXA SCAN

## 2022-12-23 ENCOUNTER — Encounter: Payer: Self-pay | Admitting: Internal Medicine

## 2022-12-23 ENCOUNTER — Ambulatory Visit (INDEPENDENT_AMBULATORY_CARE_PROVIDER_SITE_OTHER): Payer: Medicare HMO | Admitting: Internal Medicine

## 2022-12-23 VITALS — BP 134/82 | HR 89 | Temp 97.9°F | Resp 16 | Ht 63.0 in | Wt 196.2 lb

## 2022-12-23 DIAGNOSIS — Z23 Encounter for immunization: Secondary | ICD-10-CM | POA: Diagnosis not present

## 2022-12-23 DIAGNOSIS — E039 Hypothyroidism, unspecified: Secondary | ICD-10-CM

## 2022-12-23 DIAGNOSIS — Z1231 Encounter for screening mammogram for malignant neoplasm of breast: Secondary | ICD-10-CM

## 2022-12-23 DIAGNOSIS — I10 Essential (primary) hypertension: Secondary | ICD-10-CM | POA: Diagnosis not present

## 2022-12-23 MED ORDER — HYDROCHLOROTHIAZIDE 12.5 MG PO TABS: 12.5000 mg | ORAL_TABLET | Freq: Every day | ORAL | 1 refills | Status: DC

## 2022-12-23 MED ORDER — LISINOPRIL 5 MG PO TABS: 5.0000 mg | ORAL_TABLET | Freq: Every day | ORAL | 1 refills | Status: DC

## 2022-12-23 NOTE — Patient Instructions (Signed)
It was great seeing you today!  Plan discussed at today's visit: -Blood work ordered today, results will be uploaded to MyChart.  -Blood pressure medications refilled -Prevnar 20 administered today -Mammogram ordered, please call to schedule for the end of September   Follow up in: 6 months, please be fasting for labs   Take care and let us know if you have any questions or concerns prior to your next visit.  Dr. Caralee Ates

## 2022-12-23 NOTE — Progress Notes (Signed)
New Patient Office Visit  Subjective    Patient ID: Vanessa Austin, female    DOB: 1956/02/27  Age: 67 y.o. MRN: [id number]  CC:  Chief Complaint  Patient presents with   Establish Care    HPI Vanessa Austin presents to establish care.  Hypertension: -Medications: Lisinopril 5 mg, hydrochlorothiazide 12.5 mg - has been on the regimen for years -Patient is compliant with above medications and reports no side effects. -Checking BP at home (average): doesn't check -Denies any SOB, CP, vision changes, LE edema or symptoms of hypotension  Hypothyroidism/Hx of Graves: -Radioactive iodine in 1987 -Medications: Levothyroxine 150 mcg and Cytomel 5 mcg 4 tablets a day -Patient is compliant with the above medication (s) at the above dose and reports no medication side effects.  -Denies cold./heat intolerance, skin changes, anxiety/palpitations, does note more weight gain recently.  -Last TSH: 1/24   Environmental Allergies: -Takes Zyrtec   Health Maintenance: -Blood work UTD, getting records  -Mammogram 9/23 Birads-1 -Pap UTD 2/24, getting records -Colonoscopy 05/2018, repeat in 10 years   Outpatient Encounter Medications as of 12/23/2022  Medication Sig   hydrochlorothiazide (HYDRODIURIL) 12.5 MG tablet Take 12.5 mg by mouth daily.   levothyroxine (SYNTHROID) 150 MCG tablet Take 150 mcg by mouth daily.   liothyronine (CYTOMEL) 5 MCG tablet Take 5 mcg by mouth daily. 4tablets daily   lisinopril (PRINIVIL,ZESTRIL) 5 MG tablet Take 1 tablet (5 mg total) by mouth daily.   [DISCONTINUED] albuterol (VENTOLIN HFA) 108 (90 Base) MCG/ACT inhaler Inhale 2 puffs into the lungs every 6 (six) hours as needed for wheezing or shortness of breath (may use every 4-6 hours).   [DISCONTINUED] Cetirizine HCl 10 MG CAPS Take 10 mg by mouth daily.   [DISCONTINUED] Cholecalciferol 10000 UNITS CAPS Take by mouth daily.   [DISCONTINUED] doxycycline (VIBRAMYCIN) 100 MG capsule Take 1 capsule (100 mg  total) by mouth 2 (two) times daily.   [DISCONTINUED] fluticasone (FLONASE) 50 MCG/ACT nasal spray Place 2 sprays into the nose daily.   [DISCONTINUED] levothyroxine (SYNTHROID, LEVOTHROID) 125 MCG tablet Take 1 tablet (125 mcg total) by mouth daily.   No facility-administered encounter medications on file as of 12/23/2022.    Past Medical History:  Diagnosis Date   Allergy    Hypertension    Hypothyroidism    Thyroid disease     Past Surgical History:  Procedure Laterality Date   BILATERAL CARPAL TUNNEL RELEASE Bilateral 10/01/2010   Dr. Erin Sons   BREAST EXCISIONAL BIOPSY Right 01/27/2016   papilloma and complex sclerosing lesion removed   BREAST LUMPECTOMY WITH NEEDLE LOCALIZATION Right 01/27/2016   Procedure: BREAST LUMPECTOMY WITH NEEDLE LOCALIZATION;  Surgeon: Nadeen Landau, MD;  Location: [facility] ORS;  Service: General;  Laterality: Right;    Family History  Problem Relation Age of Onset   Hypertension Mother    Brain cancer Mother    Hypertension Father    Heart disease Father    Hypertension Brother    Breast cancer Neg Hx     Social History   Socioeconomic History   Marital status: Widowed    Spouse name: Dimas Aguas   Number of children: 3   Years of education: College   Highest education level: Not on file  Occupational History   Occupation: [employer]    Comment: Full-Time  Tobacco Use   Smoking status: Former    Current packs/day: 0.00    Average packs/day: 0.3 packs/day for 10.0 years (2.5 ttl pk-yrs)  Types: Cigarettes    Start date: 05/23/1970    Quit date: 05/23/1980    Years since quitting: 42.6   Smokeless tobacco: Never  Vaping Use   Vaping status: Never Used  Substance and Sexual Activity   Alcohol use: Yes    Alcohol/week: 7.0 standard drinks of alcohol    Types: 7 Glasses of wine per week   Drug use: No   Sexual activity: Yes  Other Topics Concern   Not on file  Social History Narrative   Not on file   Social Determinants  of Health   Financial Resource Strain: Not on file  Food Insecurity: Not on file  Transportation Needs: Not on file  Physical Activity: Not on file  Stress: Not on file  Social Connections: Not on file  Intimate Partner Violence: Not on file    Review of Systems  All other systems reviewed and are negative.       Objective    BP 134/82   Pulse 89   Temp 97.9 F (36.6 C)   Resp 16   Ht 5\' 3"  (1.6 m)   Wt 196 lb 3.2 oz (89 kg)   SpO2 95%   BMI 34.76 kg/m   Physical Exam Constitutional:      Appearance: Normal appearance.  HENT:     Head: Normocephalic and atraumatic.     Mouth/Throat:     Mouth: Mucous membranes are moist.     Pharynx: Oropharynx is clear.  Eyes:     Extraocular Movements: Extraocular movements intact.     Conjunctiva/sclera: Conjunctivae normal.     Pupils: Pupils are equal, round, and reactive to light.  Neck:     Comments: No thyromegaly  Cardiovascular:     Rate and Rhythm: Normal rate and regular rhythm.  Pulmonary:     Effort: Pulmonary effort is normal.     Breath sounds: Normal breath sounds.  Musculoskeletal:     Cervical back: No tenderness.     Right lower leg: No edema.     Left lower leg: No edema.  Lymphadenopathy:     Cervical: No cervical adenopathy.  Skin:    General: Skin is warm and dry.  Neurological:     General: No focal deficit present.     Mental Status: She is alert. Mental status is at baseline.  Psychiatric:        Mood and Affect: Mood normal.        Behavior: Behavior normal.         Assessment & Plan:   1. Essential hypertension: Blood pressure well controlled, continue Lisinopril 5 mg, hydrochlorothiazide 12.5 mg daily, refilled. Plan to check yearly labs at follow up.  - lisinopril (ZESTRIL) 5 MG tablet; Take 1 tablet (5 mg total) by mouth daily.  Dispense: 90 tablet; Refill: 1 - hydrochlorothiazide (HYDRODIURIL) 12.5 MG tablet; Take 1 tablet (12.5 mg total) by mouth daily.  Dispense: 90 tablet;  Refill: 1  2. Hypothyroidism, unspecified type: Recheck thyroid labs today. Currently on Levothyroxine 150 mcg and Cytomel 5 mcg 4 tablets a day.  - Thyroid Panel With TSH  3. Encounter for screening mammogram for malignant neoplasm of breast: Mammogram ordered.   - MM 3D SCREENING MAMMOGRAM BILATERAL BREAST; Future  4. Vaccine for streptococcus pneumoniae and influenza: Prevnar 20 administered today.   - Pneumococcal conjugate vaccine 20-valent (Prevnar 20)   Return in about 6 months (around 06/25/2023).   Margarita Mail, DO

## 2022-12-24 MED ORDER — LEVOTHYROXINE SODIUM 175 MCG PO TABS: 175.0000 ug | ORAL_TABLET | Freq: Every day | ORAL | 0 refills | Status: DC

## 2022-12-24 NOTE — Addendum Note (Signed)
Addended by: Margarita Mail on: 12/24/2022 08:32 AM   Modules accepted: Orders

## 2023-01-11 ENCOUNTER — Ambulatory Visit: Payer: Self-pay

## 2023-01-11 NOTE — Telephone Encounter (Signed)
Chief Complaint: Cough w/chest tightness Symptoms: Cough worse at night Frequency: Onset around 10 days after having a cold 2 weeks ago Pertinent Negatives: Patient denies other symptoms other than the chest tightness w/cough Disposition: [] ED /[] Urgent Care (no appt availability in office) / [x] Appointment(In office/virtual)/ []  [facility]/ [] Home Care/ [] Refused Recommended Disposition /[] [facility]/ []  Follow-up with PCP Additional Notes: No availability with PCP, agrees to be scheduled with any provider, scheduled tomorrow.   Reason for Disposition  Cough has been present for > 3 weeks  Answer Assessment - Initial Assessment Questions 1. ONSET: "When did the cough begin?"      About 10 days 2. SEVERITY: "How bad is the cough today?"      Keeps up at night, annoying during the day with talking and coughing 3. SPUTUM: "Describe the color of your sputum" (none, dry cough; clear, white, yellow, green)     Nothing comes up 4. HEMOPTYSIS: "Are you coughing up any blood?" If so ask: "How much?" (flecks, streaks, tablespoons, etc.)     N/A 5. DIFFICULTY BREATHING: "Are you having difficulty breathing?" If Yes, ask: "How bad is it?" (e.g., mild, moderate, severe)    - MILD: No SOB at rest, mild SOB with walking, speaks normally in sentences, can lie down, no retractions, pulse < 100.    - MODERATE: SOB at rest, SOB with minimal exertion and prefers to sit, cannot lie down flat, speaks in phrases, mild retractions, audible wheezing, pulse 100-120.    - SEVERE: Very SOB at rest, speaks in single words, struggling to breathe, sitting hunched forward, retractions, pulse > 120      No 6. FEVER: "Do you have a fever?" If Yes, ask: "What is your temperature, how was it measured, and when did it start?"     No 7. CARDIAC HISTORY: "Do you have any history of heart disease?" (e.g., heart attack, congestive heart failure)      No 8. LUNG HISTORY: "Do you have any history of  lung disease?"  (e.g., pulmonary embolus, asthma, emphysema)     No 9. OTHER SYMPTOMS: "Do you have any other symptoms?" (e.g., runny nose, wheezing, chest pain)       Chest tightness 10. TRAVEL: "Have you traveled out of the country in the last month?" (e.g., travel history, exposures)       No  Protocols used: Cough - Acute Non-Productive-A-AH

## 2023-01-12 ENCOUNTER — Ambulatory Visit: Payer: Medicare HMO | Admitting: Nurse Practitioner

## 2023-01-21 DIAGNOSIS — H524 Presbyopia: Secondary | ICD-10-CM | POA: Diagnosis not present

## 2023-01-21 DIAGNOSIS — H52222 Regular astigmatism, left eye: Secondary | ICD-10-CM | POA: Diagnosis not present

## 2023-01-30 ENCOUNTER — Other Ambulatory Visit: Payer: Self-pay | Admitting: Internal Medicine

## 2023-01-30 DIAGNOSIS — I10 Essential (primary) hypertension: Secondary | ICD-10-CM

## 2023-01-30 DIAGNOSIS — E039 Hypothyroidism, unspecified: Secondary | ICD-10-CM

## 2023-01-30 MED ORDER — LISINOPRIL 5 MG PO TABS: 5.0000 mg | ORAL_TABLET | Freq: Every day | ORAL | 1 refills | Status: DC

## 2023-01-30 MED ORDER — HYDROCHLOROTHIAZIDE 12.5 MG PO TABS: 12.5000 mg | ORAL_TABLET | Freq: Every day | ORAL | 1 refills | Status: DC

## 2023-01-30 MED ORDER — LEVOTHYROXINE SODIUM 137 MCG PO TABS: 137.0000 ug | ORAL_TABLET | Freq: Every day | ORAL | 1 refills | Status: DC

## 2023-01-30 NOTE — Progress Notes (Deleted)
Established Patient Office Visit  Subjective    Patient ID: Vanessa Austin, female    DOB: 01-25-1956  Age: 67 y.o. MRN: [id number]  CC:  No chief complaint on file.   HPI Vanessa Austin presents to establish care.  Hypertension: -Medications: Lisinopril 5 mg, hydrochlorothiazide 12.5 mg - has been on the regimen for years -Patient is compliant with above medications and reports no side effects. -Checking BP at home (average): doesn't check -Denies any SOB, CP, vision changes, LE edema or symptoms of hypotension  Hypothyroidism/Hx of Graves: -Radioactive iodine in 1987 -Medications: Levothyroxine changed to 175 mcg and Cytomel 5 mcg 4 tablets a day -Patient is compliant with the above medication (s) at the above dose and reports no medication side effects.  -Denies cold./heat intolerance, skin changes, anxiety/palpitations, does note more weight gain recently.  -Last TSH: 8/24 0.03   Environmental Allergies: -Takes Zyrtec   Health Maintenance: -Blood work UTD, getting records  -Mammogram 9/23 Birads-1 -Pap UTD 2/24, getting records -Colonoscopy 05/2018, repeat in 10 years   Outpatient Encounter Medications as of 01/31/2023  Medication Sig   hydrochlorothiazide (HYDRODIURIL) 12.5 MG tablet Take 1 tablet (12.5 mg total) by mouth daily.   levothyroxine (SYNTHROID) 175 MCG tablet Take 1 tablet (175 mcg total) by mouth daily.   liothyronine (CYTOMEL) 5 MCG tablet Take 5 mcg by mouth daily. 4tablets daily   lisinopril (ZESTRIL) 5 MG tablet Take 1 tablet (5 mg total) by mouth daily.   No facility-administered encounter medications on file as of 01/31/2023.    Past Medical History:  Diagnosis Date   Allergy    Hypertension    Hypothyroidism    Thyroid disease     Past Surgical History:  Procedure Laterality Date   BILATERAL CARPAL TUNNEL RELEASE Bilateral 10/01/2010   Dr. Erin Sons   BREAST EXCISIONAL BIOPSY Right 01/27/2016   papilloma and complex sclerosing  lesion removed   BREAST LUMPECTOMY WITH NEEDLE LOCALIZATION Right 01/27/2016   Procedure: BREAST LUMPECTOMY WITH NEEDLE LOCALIZATION;  Surgeon: Nadeen Landau, MD;  Location: [facility] ORS;  Service: General;  Laterality: Right;    Family History  Problem Relation Age of Onset   Hypertension Mother    Brain cancer Mother    Hypertension Father    Heart disease Father    Hypertension Brother    Breast cancer Neg Hx     Social History   Socioeconomic History   Marital status: Widowed    Spouse name: Dimas Aguas   Number of children: 3   Years of education: College   Highest education level: Not on file  Occupational History   Occupation: [employer]    Comment: Full-Time  Tobacco Use   Smoking status: Former    Current packs/day: 0.00    Average packs/day: 0.3 packs/day for 10.0 years (2.5 ttl pk-yrs)    Types: Cigarettes    Start date: 05/23/1970    Quit date: 05/23/1980    Years since quitting: 42.7   Smokeless tobacco: Never  Vaping Use   Vaping status: Never Used  Substance and Sexual Activity   Alcohol use: Yes    Alcohol/week: 7.0 standard drinks of alcohol    Types: 7 Glasses of wine per week   Drug use: No   Sexual activity: Yes  Other Topics Concern   Not on file  Social History Narrative   Not on file   Social Determinants of Health   Financial Resource Strain: Not on file  Food Insecurity:  Not on file  Transportation Needs: Not on file  Physical Activity: Not on file  Stress: Not on file  Social Connections: Not on file  Intimate Partner Violence: Not on file    Review of Systems  All other systems reviewed and are negative.       Objective    There were no vitals taken for this visit.  Physical Exam Constitutional:      Appearance: Normal appearance.  HENT:     Head: Normocephalic and atraumatic.     Mouth/Throat:     Mouth: Mucous membranes are moist.     Pharynx: Oropharynx is clear.  Eyes:     Extraocular Movements: Extraocular  movements intact.     Conjunctiva/sclera: Conjunctivae normal.     Pupils: Pupils are equal, round, and reactive to light.  Neck:     Comments: No thyromegaly  Cardiovascular:     Rate and Rhythm: Normal rate and regular rhythm.  Pulmonary:     Effort: Pulmonary effort is normal.     Breath sounds: Normal breath sounds.  Musculoskeletal:     Cervical back: No tenderness.     Right lower leg: No edema.     Left lower leg: No edema.  Lymphadenopathy:     Cervical: No cervical adenopathy.  Skin:    General: Skin is warm and dry.  Neurological:     General: No focal deficit present.     Mental Status: She is alert. Mental status is at baseline.  Psychiatric:        Mood and Affect: Mood normal.        Behavior: Behavior normal.         Assessment & Plan:   1. Essential hypertension: Blood pressure well controlled, continue Lisinopril 5 mg, hydrochlorothiazide 12.5 mg daily, refilled. Plan to check yearly labs at follow up.  - lisinopril (ZESTRIL) 5 MG tablet; Take 1 tablet (5 mg total) by mouth daily.  Dispense: 90 tablet; Refill: 1 - hydrochlorothiazide (HYDRODIURIL) 12.5 MG tablet; Take 1 tablet (12.5 mg total) by mouth daily.  Dispense: 90 tablet; Refill: 1  2. Hypothyroidism, unspecified type: Recheck thyroid labs today. Currently on Levothyroxine 150 mcg and Cytomel 5 mcg 4 tablets a day.  - Thyroid Panel With TSH  3. Encounter for screening mammogram for malignant neoplasm of breast: Mammogram ordered.   - MM 3D SCREENING MAMMOGRAM BILATERAL BREAST; Future  4. Vaccine for streptococcus pneumoniae and influenza: Prevnar 20 administered today.   - Pneumococcal conjugate vaccine 20-valent (Prevnar 20)   No follow-ups on file.   Margarita Mail, DO

## 2023-01-31 ENCOUNTER — Ambulatory Visit: Payer: Medicare HMO | Admitting: Internal Medicine

## 2023-01-31 ENCOUNTER — Telehealth: Payer: Self-pay

## 2023-01-31 DIAGNOSIS — I10 Essential (primary) hypertension: Secondary | ICD-10-CM

## 2023-01-31 DIAGNOSIS — E039 Hypothyroidism, unspecified: Secondary | ICD-10-CM

## 2023-01-31 NOTE — Telephone Encounter (Signed)
Today's appt has been cancelled and rescheduled for Oct 21

## 2023-01-31 NOTE — Telephone Encounter (Signed)
Please cancel appt at 9:40 today, she already knows Dr. Caralee Ates, called her this weekend and reschedule for 6 weeks out.

## 2023-02-09 DIAGNOSIS — M25562 Pain in left knee: Secondary | ICD-10-CM | POA: Diagnosis not present

## 2023-02-15 ENCOUNTER — Ambulatory Visit
Admission: RE | Admit: 2023-02-15 | Discharge: 2023-02-15 | Disposition: A | Discharge status: Home / Self Care | Payer: Medicare HMO | Source: Ambulatory Visit | Attending: Internal Medicine | Admitting: Internal Medicine

## 2023-02-15 DIAGNOSIS — Z1231 Encounter for screening mammogram for malignant neoplasm of breast: Secondary | ICD-10-CM | POA: Diagnosis not present

## 2023-02-16 ENCOUNTER — Other Ambulatory Visit: Payer: Self-pay | Admitting: Internal Medicine

## 2023-02-16 DIAGNOSIS — R928 Other abnormal and inconclusive findings on diagnostic imaging of breast: Secondary | ICD-10-CM

## 2023-02-18 ENCOUNTER — Ambulatory Visit
Admission: RE | Admit: 2023-02-18 | Discharge: 2023-02-18 | Disposition: A | Discharge status: Home / Self Care | Payer: Medicare HMO | Source: Ambulatory Visit | Attending: Internal Medicine

## 2023-02-18 ENCOUNTER — Ambulatory Visit
Admission: RE | Admit: 2023-02-18 | Discharge: 2023-02-18 | Disposition: A | Discharge status: Home / Self Care | Payer: Medicare HMO | Source: Ambulatory Visit | Attending: Internal Medicine | Admitting: Internal Medicine

## 2023-02-18 DIAGNOSIS — R928 Other abnormal and inconclusive findings on diagnostic imaging of breast: Secondary | ICD-10-CM

## 2023-02-18 DIAGNOSIS — R92321 Mammographic fibroglandular density, right breast: Secondary | ICD-10-CM | POA: Diagnosis not present

## 2023-02-18 DIAGNOSIS — N6310 Unspecified lump in the right breast, unspecified quadrant: Secondary | ICD-10-CM | POA: Diagnosis not present

## 2023-02-28 ENCOUNTER — Other Ambulatory Visit: Payer: Medicare HMO

## 2023-03-07 DIAGNOSIS — H524 Presbyopia: Secondary | ICD-10-CM | POA: Diagnosis not present

## 2023-03-13 NOTE — Progress Notes (Unsigned)
New Patient Office Visit  Subjective    Patient ID: Vanessa Austin, female    DOB: 11-Oct-1955  Age: 67 y.o. MRN: [id number]  CC:  No chief complaint on file.   HPI Vanessa Austin presents to establish care.  Hypertension: -Medications: Lisinopril 5 mg, hydrochlorothiazide 12.5 mg - has been on the regimen for years -Patient is compliant with above medications and reports no side effects. -Checking BP at home (average): doesn't check -Denies any SOB, CP, vision changes, LE edema or symptoms of hypotension  Hypothyroidism/Hx of Graves: -Radioactive iodine in 1987 -Medications: Levothyroxine decreased to 137 mcg and Cytomel 5 mcg 4 tablets a day -Patient is compliant with the above medication (s) at the above dose and reports no medication side effects.  -Denies cold./heat intolerance, skin changes, anxiety/palpitations, does note more weight gain recently.  -Last TSH 8/24: 0.03  Environmental Allergies: -Takes Zyrtec   Health Maintenance: -Blood work UTD, getting records  -Mammogram 9/23 Birads-1 -Pap UTD 2/24, getting records -Colonoscopy 05/2018, repeat in 10 years   Outpatient Encounter Medications as of 03/14/2023  Medication Sig   hydrochlorothiazide (HYDRODIURIL) 12.5 MG tablet Take 1 tablet (12.5 mg total) by mouth daily.   levothyroxine (SYNTHROID) 137 MCG tablet Take 1 tablet (137 mcg total) by mouth daily before breakfast.   liothyronine (CYTOMEL) 5 MCG tablet Take 5 mcg by mouth daily. 4tablets daily   lisinopril (ZESTRIL) 5 MG tablet Take 1 tablet (5 mg total) by mouth daily.   No facility-administered encounter medications on file as of 03/14/2023.    Past Medical History:  Diagnosis Date   Allergy    Hypertension    Hypothyroidism    Thyroid disease     Past Surgical History:  Procedure Laterality Date   BILATERAL CARPAL TUNNEL RELEASE Bilateral 10/01/2010   Dr. Erin Sons   BREAST EXCISIONAL BIOPSY Right 01/27/2016   papilloma and complex  sclerosing lesion removed   BREAST LUMPECTOMY WITH NEEDLE LOCALIZATION Right 01/27/2016   Procedure: BREAST LUMPECTOMY WITH NEEDLE LOCALIZATION;  Surgeon: Nadeen Landau, MD;  Location: [facility] ORS;  Service: General;  Laterality: Right;    Family History  Problem Relation Age of Onset   Hypertension Mother    Brain cancer Mother    Hypertension Father    Heart disease Father    Hypertension Brother    Breast cancer Neg Hx     Social History   Socioeconomic History   Marital status: Widowed    Spouse name: Dimas Aguas   Number of children: 3   Years of education: College   Highest education level: Not on file  Occupational History   Occupation: [employer]    Comment: Full-Time  Tobacco Use   Smoking status: Former    Current packs/day: 0.00    Average packs/day: 0.3 packs/day for 10.0 years (2.5 ttl pk-yrs)    Types: Cigarettes    Start date: 05/23/1970    Quit date: 05/23/1980    Years since quitting: 42.8   Smokeless tobacco: Never  Vaping Use   Vaping status: Never Used  Substance and Sexual Activity   Alcohol use: Yes    Alcohol/week: 7.0 standard drinks of alcohol    Types: 7 Glasses of wine per week   Drug use: No   Sexual activity: Yes  Other Topics Concern   Not on file  Social History Narrative   Not on file   Social Determinants of Health   Financial Resource Strain: Not on file  Food  Insecurity: Not on file  Transportation Needs: Not on file  Physical Activity: Not on file  Stress: Not on file  Social Connections: Not on file  Intimate Partner Violence: Not on file    Review of Systems  All other systems reviewed and are negative.       Objective    There were no vitals taken for this visit.  Physical Exam Constitutional:      Appearance: Normal appearance.  HENT:     Head: Normocephalic and atraumatic.     Mouth/Throat:     Mouth: Mucous membranes are moist.     Pharynx: Oropharynx is clear.  Eyes:     Extraocular Movements:  Extraocular movements intact.     Conjunctiva/sclera: Conjunctivae normal.     Pupils: Pupils are equal, round, and reactive to light.  Neck:     Comments: No thyromegaly  Cardiovascular:     Rate and Rhythm: Normal rate and regular rhythm.  Pulmonary:     Effort: Pulmonary effort is normal.     Breath sounds: Normal breath sounds.  Musculoskeletal:     Cervical back: No tenderness.     Right lower leg: No edema.     Left lower leg: No edema.  Lymphadenopathy:     Cervical: No cervical adenopathy.  Skin:    General: Skin is warm and dry.  Neurological:     General: No focal deficit present.     Mental Status: She is alert. Mental status is at baseline.  Psychiatric:        Mood and Affect: Mood normal.        Behavior: Behavior normal.         Assessment & Plan:   1. Essential hypertension: Blood pressure well controlled, continue Lisinopril 5 mg, hydrochlorothiazide 12.5 mg daily, refilled. Plan to check yearly labs at follow up.  - lisinopril (ZESTRIL) 5 MG tablet; Take 1 tablet (5 mg total) by mouth daily.  Dispense: 90 tablet; Refill: 1 - hydrochlorothiazide (HYDRODIURIL) 12.5 MG tablet; Take 1 tablet (12.5 mg total) by mouth daily.  Dispense: 90 tablet; Refill: 1  2. Hypothyroidism, unspecified type: Recheck thyroid labs today. Currently on Levothyroxine 150 mcg and Cytomel 5 mcg 4 tablets a day.  - Thyroid Panel With TSH  3. Encounter for screening mammogram for malignant neoplasm of breast: Mammogram ordered.   - MM 3D SCREENING MAMMOGRAM BILATERAL BREAST; Future  4. Vaccine for streptococcus pneumoniae and influenza: Prevnar 20 administered today.   - Pneumococcal conjugate vaccine 20-valent (Prevnar 20)   No follow-ups on file.   Margarita Mail, DO

## 2023-03-14 ENCOUNTER — Encounter: Payer: Self-pay | Admitting: Internal Medicine

## 2023-03-14 ENCOUNTER — Ambulatory Visit (INDEPENDENT_AMBULATORY_CARE_PROVIDER_SITE_OTHER): Payer: Medicare HMO | Admitting: Internal Medicine

## 2023-03-14 VITALS — BP 126/70 | HR 78 | Temp 97.6°F | Resp 18 | Ht 63.0 in | Wt 200.5 lb

## 2023-03-14 DIAGNOSIS — I10 Essential (primary) hypertension: Secondary | ICD-10-CM | POA: Diagnosis not present

## 2023-03-14 DIAGNOSIS — M25462 Effusion, left knee: Secondary | ICD-10-CM | POA: Diagnosis not present

## 2023-03-14 DIAGNOSIS — E039 Hypothyroidism, unspecified: Secondary | ICD-10-CM | POA: Diagnosis not present

## 2023-03-15 LAB — THYROID PANEL WITH TSH
Free Thyroxine Index: 2.6 (ref 1.4–3.8)
T3 Uptake: 33 % (ref 22–35)
T4, Total: 7.8 ug/dL (ref 5.1–11.9)
TSH: 0.02 m[IU]/L — ABNORMAL LOW (ref 0.40–4.50)

## 2023-03-15 MED ORDER — LEVOTHYROXINE SODIUM 125 MCG PO TABS: 125.0000 ug | ORAL_TABLET | Freq: Every day | ORAL | 0 refills | Status: DC

## 2023-03-15 NOTE — Addendum Note (Signed)
Addended by: Margarita Mail on: 03/15/2023 01:01 PM   Modules accepted: Orders

## 2023-03-23 DIAGNOSIS — M25462 Effusion, left knee: Secondary | ICD-10-CM | POA: Diagnosis not present

## 2023-03-25 DIAGNOSIS — M25462 Effusion, left knee: Secondary | ICD-10-CM | POA: Diagnosis not present

## 2023-04-18 ENCOUNTER — Encounter: Payer: Self-pay | Admitting: Internal Medicine

## 2023-04-18 ENCOUNTER — Other Ambulatory Visit: Payer: Self-pay

## 2023-04-18 DIAGNOSIS — E039 Hypothyroidism, unspecified: Secondary | ICD-10-CM

## 2023-05-09 DIAGNOSIS — E039 Hypothyroidism, unspecified: Secondary | ICD-10-CM | POA: Diagnosis not present

## 2023-05-10 ENCOUNTER — Other Ambulatory Visit: Payer: Self-pay | Admitting: Internal Medicine

## 2023-05-10 DIAGNOSIS — E039 Hypothyroidism, unspecified: Secondary | ICD-10-CM

## 2023-05-10 LAB — TSH: TSH: 0.04 m[IU]/L — ABNORMAL LOW (ref 0.40–4.50)

## 2023-05-10 MED ORDER — LEVOTHYROXINE SODIUM 100 MCG PO TABS: 100.0000 ug | ORAL_TABLET | Freq: Every day | ORAL | 1 refills | Status: DC

## 2023-05-27 ENCOUNTER — Encounter: Payer: Self-pay | Admitting: Internal Medicine

## 2023-05-30 DIAGNOSIS — M25562 Pain in left knee: Secondary | ICD-10-CM | POA: Diagnosis not present

## 2023-06-09 ENCOUNTER — Other Ambulatory Visit: Payer: Self-pay | Admitting: Internal Medicine

## 2023-06-09 DIAGNOSIS — I10 Essential (primary) hypertension: Secondary | ICD-10-CM

## 2023-06-09 DIAGNOSIS — E039 Hypothyroidism, unspecified: Secondary | ICD-10-CM

## 2023-06-10 NOTE — Telephone Encounter (Signed)
Requested Prescriptions  Refused Prescriptions Disp Refills   hydrochlorothiazide (HYDRODIURIL) 12.5 MG tablet [Pharmacy Med Name: HYDROCHLOROTHIAZIDE 12.5 MG TAB] 90 tablet 1    Sig: TAKE ONE TABLET BY MOUTH ONE TIME DAILY     Cardiovascular: Diuretics - Thiazide Failed - 06/10/2023  9:44 AM      Failed - Cr in normal range and within 180 days    Creatinine, Ser  Date Value Ref Range Status  11/16/2016 0.86 0.57 - 1.00 mg/dL Final         Failed - K in normal range and within 180 days    Potassium  Date Value Ref Range Status  11/16/2016 4.7 3.5 - 5.2 mmol/L Final         Failed - Na in normal range and within 180 days    Sodium  Date Value Ref Range Status  11/16/2016 141 134 - 144 mmol/L Final         Passed - Last BP in normal range    BP Readings from Last 1 Encounters:  03/14/23 126/70         Passed - Valid encounter within last 6 months    Recent Outpatient Visits           2 months ago Hypothyroidism, unspecified type   [facility] Margarita Mail, DO   5 months ago Essential hypertension   [facility] Margarita Mail, DO   6 years ago Annual physical exam   [facility] Joycelyn Man M, [place]   7 years ago Viral upper respiratory tract infection   [facility] [place], [place]   7 years ago Essential hypertension   [facility], Alessandra [place]vels, [place]       Future Appointments             In 2 weeks Margarita Mail, DO [facility]             levothyroxine (SYNTHROID) 125 MCG tablet [Pharmacy Med Name: LEVOTHYROXINE 125 MCG TAB[*]] 90 tablet 0    Sig: TAKE ONE TABLET BY MOUTH ONCE DAILY     Endocrinology:  Hypothyroid Agents Failed - 06/10/2023  9:44 AM      Failed - TSH in normal range and within 360 days    TSH  Date Value Ref Range Status  05/09/2023 0.04 (L)  0.40 - 4.50 mIU/L Final         Passed - Valid encounter within last 12 months    Recent Outpatient Visits           2 months ago Hypothyroidism, unspecified type   [facility] Margarita Mail, DO   5 months ago Essential hypertension   [facility] Margarita Mail, DO   6 years ago Annual physical exam   [facility] Joycelyn Man M, [place]   7 years ago Viral upper respiratory tract infection   [facility] [place], [place]   7 years ago Essential hypertension   [facility] [place], Alessandra Bevels, [place]       Future Appointments             In 2 weeks Margarita Mail, DO [facility], [facility]

## 2023-06-29 ENCOUNTER — Encounter: Payer: Self-pay | Admitting: Internal Medicine

## 2023-06-29 ENCOUNTER — Other Ambulatory Visit: Payer: Self-pay

## 2023-06-29 ENCOUNTER — Ambulatory Visit: Payer: Medicare HMO | Admitting: Internal Medicine

## 2023-06-29 VITALS — BP 120/84 | HR 75 | Temp 97.5°F | Resp 16 | Ht 62.5 in | Wt 198.4 lb

## 2023-06-29 DIAGNOSIS — E039 Hypothyroidism, unspecified: Secondary | ICD-10-CM

## 2023-06-29 DIAGNOSIS — Z0000 Encounter for general adult medical examination without abnormal findings: Secondary | ICD-10-CM | POA: Diagnosis not present

## 2023-06-29 DIAGNOSIS — Z1159 Encounter for screening for other viral diseases: Secondary | ICD-10-CM

## 2023-06-29 DIAGNOSIS — Z13220 Encounter for screening for lipoid disorders: Secondary | ICD-10-CM

## 2023-06-29 DIAGNOSIS — Z0001 Encounter for general adult medical examination with abnormal findings: Secondary | ICD-10-CM | POA: Diagnosis not present

## 2023-06-29 NOTE — Progress Notes (Signed)
 Name: Vanessa Austin   MRN: [id number]    DOB: [birth year]   Date:06/29/2023       Progress Note  Subjective  Chief Complaint  Chief Complaint  Patient presents with   Annual Exam    HPI  Patient presents for annual CPE.  Hypothyroidism: -Medications: Levothyroxine  100 mcg (decreased in December since last labs) and Liothyronine 5 mcg -Patient is compliant with the above medication (s) at the above dose and reports no medication side effects.  -Denies weight changes, cold./heat intolerance, skin changes, anxiety/palpitations  -Last TSH: 12/24 0.04 -Patient may also be taking biotin in a vitamin   Diet: Regular Exercise: 4 days [facility] and weights  Last Eye Exam: completed Last Dental Exam: completed  Flowsheet Row Office Visit from 06/29/2023 in [facility]  AUDIT-C Score 0      Depression: Phq 9 is  negative    06/29/2023    8:13 AM 03/14/2023    9:35 AM 12/23/2022    9:34 AM 11/16/2016    8:59 AM 11/12/2015   11:22 AM  Depression screen PHQ 2/9  Decreased Interest 0 0 0 0 0  Down, Depressed, Hopeless 0 0 0 0 0  PHQ - 2 Score 0 0 0 0 0  Altered sleeping  0 0 0   Tired, decreased energy  0 0 0   Change in appetite  0 0 0   Feeling bad or failure about yourself   0 0 0   Trouble concentrating  0 0 0   Moving slowly or fidgety/restless  0 0 0   Suicidal thoughts  0 0 0   PHQ-9 Score  0 0 0   Difficult doing work/chores  Not difficult at all Not difficult at all Not difficult at all    Hypertension: BP Readings from Last 3 Encounters:  06/29/23 120/84  03/14/23 126/70  12/23/22 134/82   Obesity: Wt Readings from Last 3 Encounters:  06/29/23 198 lb 6.4 oz (90 kg)  03/14/23 200 lb 8 oz (90.9 kg)  12/23/22 196 lb 3.2 oz (89 kg)   BMI Readings from Last 3 Encounters:  06/29/23 35.71 kg/m  03/14/23 35.52 kg/m  12/23/22 34.76 kg/m     Vaccines: reviewed with the patient. Discussed Shingles  vaccines and recommend obtaining at the pharmacy.  Hep C Screening: getting today STD testing and prevention (HIV/chl/gon/syphilis): no concerns Intimate partner violence: negative screen  LMP: 10 year ago, natural menopause, no issues Discussed importance of follow up if any post-menopausal bleeding: yes, no issues Incontinence Symptoms: negative for symptoms   Breast cancer:  - Last Mammogram: 02/18/2023 Birads-2  Osteoporosis Prevention : Discussed high calcium and vitamin D  supplementation, weight bearing exercises Bone density :yes 08/18/2022  Cervical cancer screening: up-to-date, had 2 years ago   Skin cancer: Discussed monitoring for atypical lesions  Colorectal cancer: 06/02/2018, colonoscopy can repeat in 10 years Lung cancer:  Low Dose CT Chest recommended if Age 17-80 years, 20 pack-year currently smoking OR have quit w/in 15years. Patient does not qualify for screen   ECG:01/07/2015  Advanced Care Planning: A voluntary discussion about advance care planning including the explanation and discussion of advance directives.  Discussed health care proxy and Living will, and the patient was able to identify a health care proxy as Starlynn Klinkner (Son).  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 Helayne Glenn   BREAST EXCISIONAL BIOPSY Right 01/27/2016   papilloma and complex sclerosing lesion removed   BREAST  LUMPECTOMY WITH NEEDLE LOCALIZATION Right 01/27/2016   Procedure: BREAST LUMPECTOMY WITH NEEDLE LOCALIZATION;  Surgeon: Larinda Unknown Sharps, MD;  Location: [facility] ORS;  Service: General;  Laterality: Right;    Family History  Problem Relation Age of Onset   Hypertension Mother    Brain cancer Mother    Hypertension Father    Heart disease Father    Hypertension Brother    Breast cancer Neg Hx     Social History   Socioeconomic History   Marital status: Widowed    Spouse name: Kayla   Number of children: 3   Years of education: College   Highest education level: Not on file  Occupational History   Occupation: [employer]    Comment: Full-Time  Tobacco Use   Smoking status: Former    Current packs/day: 0.00    Average packs/day: 0.3 packs/day for 10.0 years (2.5 ttl pk-yrs)    Types: Cigarettes    Start date: 05/23/1970    Quit date: 05/23/1980    Years since quitting: 43.1   Smokeless tobacco: Never  Vaping Use   Vaping status: Never Used  Substance and Sexual Activity   Alcohol use: Yes    Alcohol/week: 7.0 standard drinks of alcohol    Types: 7 Glasses of wine per week   Drug use: No   Sexual activity: Yes  Other Topics Concern   Not on file  Social History Narrative   Not on file   Social Drivers of Health   Financial Resource Strain: Low Risk  (06/29/2023)   Overall Financial Resource Strain (CARDIA)    Difficulty of Paying Living Expenses: Not hard at all  Food Insecurity: No Food Insecurity (06/29/2023)   Hunger Vital Sign    Worried About Running Out of Food in the Last Year: Never true    Ran Out of Food in the Last Year: Never true  Transportation Needs: No Transportation Needs (06/29/2023)   PRAPARE - Administrator, Civil Service (Medical): No    Lack of Transportation (Non-Medical): No  Physical Activity: Insufficiently Active (06/29/2023)   Exercise Vital Sign    Days of Exercise per Week: 4 days    Minutes of Exercise per Session: 30 min   Stress: No Stress Concern Present (06/29/2023)   [employer] of Occupational Health - Occupational Stress Questionnaire    Feeling of Stress : Only a little  Social Connections: Socially Isolated (06/29/2023)   Social Connection and Isolation Panel [NHANES]    Frequency of Communication with Friends and Family: More than three times a week    Frequency of Social Gatherings with Friends and Family: More than three times a week    Attends Religious Services: Never    Database Administrator or Organizations: No    Attends Banker Meetings: Never    Marital Status: Widowed  Intimate Partner Violence:  Not At Risk (06/29/2023)   Humiliation, Afraid, Rape, and Kick questionnaire    Fear of Current or Ex-Partner: No    Emotionally Abused: No    Physically Abused: No    Sexually Abused: No     Current Outpatient Medications:    hydrochlorothiazide  (HYDRODIURIL ) 12.5 MG tablet, Take 1 tablet (12.5 mg total) by mouth daily., Disp: 90 tablet, Rfl: 1   levothyroxine  (SYNTHROID ) 100 MCG tablet, Take 1 tablet (100 mcg total) by mouth daily., Disp: 30 tablet, Rfl: 1   liothyronine (CYTOMEL) 5 MCG tablet, Take 5 mcg by mouth daily. 4tablets daily, Disp: , Rfl:    lisinopril  (ZESTRIL ) 5 MG tablet, Take 1 tablet (5 mg total) by mouth daily., Disp: 90 tablet, Rfl: 1  Allergies  Allergen Reactions   Etodolac    Penicillins    Sulfa Antibiotics    Meloxicam Rash     Review of Systems  All other systems reviewed and are negative.   Objective  Vitals:   06/29/23 0818  BP: 120/84  Pulse: 75  Resp: 16  Temp: (!) 97.5 F (36.4 C)  TempSrc: Oral  SpO2: 98%  Weight: 198 lb 6.4 oz (90 kg)  Height: 5' 2.5 (1.588 m)    Body mass index is 35.71 kg/m.  Physical Exam Constitutional:      Appearance: Normal appearance.  HENT:     Head: Normocephalic and atraumatic.     Mouth/Throat:     Mouth: Mucous membranes are moist.     Pharynx: Oropharynx is clear.  Eyes:      Extraocular Movements: Extraocular movements intact.     Conjunctiva/sclera: Conjunctivae normal.     Pupils: Pupils are equal, round, and reactive to light.  Neck:     Comments: No thyromegaly Cardiovascular:     Rate and Rhythm: Normal rate and regular rhythm.  Pulmonary:     Effort: Pulmonary effort is normal.     Breath sounds: Normal breath sounds.  Musculoskeletal:     Cervical back: No tenderness.     Right lower leg: No edema.     Left lower leg: No edema.  Lymphadenopathy:     Cervical: No cervical adenopathy.  Skin:    General: Skin is warm and dry.  Neurological:     General: No focal deficit present.     Mental Status: She is alert. Mental status is at baseline.  Psychiatric:        Mood and Affect: Mood normal.        Behavior: Behavior normal.     Last CBC Lab Results  Component Value Date   WBC 5.9 11/16/2016   HGB 13.7 11/16/2016   HCT 41.7 11/16/2016   MCV 91 11/16/2016   MCH 30.0 11/16/2016   RDW 13.8 11/16/2016   PLT 319 11/16/2016   Last metabolic panel Lab Results  Component Value Date   GLUCOSE 89 11/16/2016   NA 141 11/16/2016   K 4.7 11/16/2016   CL 102 11/16/2016   CO2 24 11/16/2016   BUN 17 11/16/2016   CREATININE 0.86 11/16/2016   GFRNONAA 74 11/16/2016   CALCIUM 9.8 11/16/2016   PROT 7.1 11/16/2016   ALBUMIN 4.7 11/16/2016   LABGLOB 2.4 11/16/2016   AGRATIO 2.0 11/16/2016   BILITOT 0.3 11/16/2016   ALKPHOS 73 11/16/2016   AST 19 11/16/2016   ALT 18 11/16/2016   Last lipids Lab Results  Component Value Date   CHOL 249 (H) 11/16/2016   HDL 70 11/16/2016  LDLCALC 163 (H) 11/16/2016   TRIG 78 11/16/2016   CHOLHDL 3.3 11/14/2015   Last hemoglobin A1c No results found for: HGBA1C Last thyroid  functions Lab Results  Component Value Date   TSH 0.04 (L) 05/09/2023   T4TOTAL 7.8 03/14/2023   Last vitamin D  Lab Results  Component Value Date   VD25OH 38.1 11/16/2016   Last vitamin B12 and Folate No results found for:  VITAMINB12, FOLATE    Assessment & Plan  1. Annual physical exam (Primary)/Lipid screening/Need for hepatitis C screening test: Physical exam completed, health maintenance reviewed and annual labs ordered.   - Thyroid  Panel With TSH - CBC w/Diff/Platelet - COMPLETE METABOLIC PANEL WITH GFR - Lipid Profile - Hepatitis C Antibody  2. Hypothyroidism, unspecified type: Recheck thyroid  labs, if still abnormal with discontinue Liothyronine and decrease Levothyroxine .   - Thyroid  Panel With TSH   -USPSTF grade A and B recommendations reviewed with patient; age-appropriate recommendations, preventive care, screening tests, etc discussed and encouraged; healthy living encouraged; see AVS for patient education given to patient -Discussed importance of 150 minutes of physical activity weekly, eat two servings of fish weekly, eat one serving of tree nuts ( cashews, pistachios, pecans, almonds.SABRA) every other day, eat 6 servings of fruit/vegetables daily and drink plenty of water and avoid sweet beverages.   -Reviewed Health Maintenance: Yes.

## 2023-06-30 LAB — COMPLETE METABOLIC PANEL WITH GFR
AG Ratio: 1.9 (calc) (ref 1.0–2.5)
ALT: 31 U/L — ABNORMAL HIGH (ref 6–29)
AST: 22 U/L (ref 10–35)
Albumin: 4.3 g/dL (ref 3.6–5.1)
Alkaline phosphatase (APISO): 76 U/L (ref 37–153)
BUN: 23 mg/dL (ref 7–25)
CO2: 27 mmol/L (ref 20–32)
Calcium: 10.3 mg/dL (ref 8.6–10.4)
Chloride: 103 mmol/L (ref 98–110)
Creat: 0.85 mg/dL (ref 0.50–1.05)
Globulin: 2.3 g/dL (ref 1.9–3.7)
Glucose, Bld: 93 mg/dL (ref 65–99)
Potassium: 4.3 mmol/L (ref 3.5–5.3)
Sodium: 138 mmol/L (ref 135–146)
Total Bilirubin: 0.6 mg/dL (ref 0.2–1.2)
Total Protein: 6.6 g/dL (ref 6.1–8.1)
eGFR: 76 mL/min/{1.73_m2} (ref >=60)

## 2023-06-30 LAB — CBC WITH DIFFERENTIAL/PLATELET
Absolute Lymphocytes: 1496 {cells}/uL (ref 850–3900)
Absolute Monocytes: 383 {cells}/uL (ref 200–950)
Basophils Absolute: 48 {cells}/uL (ref 0–200)
Basophils Relative: 1.1 %
Eosinophils Absolute: 150 {cells}/uL (ref 15–500)
Eosinophils Relative: 3.4 %
HCT: 40.9 % (ref 35.0–45.0)
Hemoglobin: 13.4 g/dL (ref 11.7–15.5)
MCH: 30.3 pg (ref 27.0–33.0)
MCHC: 32.8 g/dL (ref 32.0–36.0)
MCV: 92.5 fL (ref 80.0–100.0)
MPV: 12 fL (ref 7.5–12.5)
Monocytes Relative: 8.7 %
Neutro Abs: 2323 {cells}/uL (ref 1500–7800)
Neutrophils Relative %: 52.8 %
Platelets: 259 10*3/uL (ref 140–400)
RBC: 4.42 10*6/uL (ref 3.80–5.10)
RDW: 12.6 % (ref 11.0–15.0)
Total Lymphocyte: 34 %
WBC: 4.4 10*3/uL (ref 3.8–10.8)

## 2023-06-30 LAB — THYROID PANEL WITH TSH
Free Thyroxine Index: 2.4 (ref 1.4–3.8)
T3 Uptake: 34 % (ref 22–35)
T4, Total: 7.1 ug/dL (ref 5.1–11.9)
TSH: 0.07 m[IU]/L — ABNORMAL LOW (ref 0.40–4.50)

## 2023-06-30 LAB — LIPID PANEL
Cholesterol: 259 mg/dL — ABNORMAL HIGH (ref <200)
HDL: 83 mg/dL (ref >=50)
LDL Cholesterol (Calc): 157 mg/dL — ABNORMAL HIGH
Non-HDL Cholesterol (Calc): 176 mg/dL — ABNORMAL HIGH (ref <130)
Total CHOL/HDL Ratio: 3.1 (calc) (ref <5.0)
Triglycerides: 86 mg/dL (ref <150)

## 2023-06-30 LAB — HEPATITIS C ANTIBODY: Hepatitis C Ab: NONREACTIVE

## 2023-07-01 DIAGNOSIS — D225 Melanocytic nevi of trunk: Secondary | ICD-10-CM | POA: Diagnosis not present

## 2023-07-01 DIAGNOSIS — D2272 Melanocytic nevi of left lower limb, including hip: Secondary | ICD-10-CM | POA: Diagnosis not present

## 2023-07-01 DIAGNOSIS — D2261 Melanocytic nevi of right upper limb, including shoulder: Secondary | ICD-10-CM | POA: Diagnosis not present

## 2023-07-01 DIAGNOSIS — D2271 Melanocytic nevi of right lower limb, including hip: Secondary | ICD-10-CM | POA: Diagnosis not present

## 2023-07-01 DIAGNOSIS — D2262 Melanocytic nevi of left upper limb, including shoulder: Secondary | ICD-10-CM | POA: Diagnosis not present

## 2023-07-01 MED ORDER — LEVOTHYROXINE SODIUM 88 MCG PO TABS: 88.0000 ug | ORAL_TABLET | Freq: Every day | ORAL | 0 refills | Status: DC

## 2023-07-01 NOTE — Addendum Note (Signed)
 Addended by: Rockney Cid on: 07/01/2023 10:40 AM   Modules accepted: Orders

## 2023-09-06 ENCOUNTER — Other Ambulatory Visit: Payer: Self-pay | Admitting: Internal Medicine

## 2023-09-06 DIAGNOSIS — E039 Hypothyroidism, unspecified: Secondary | ICD-10-CM

## 2023-09-07 ENCOUNTER — Other Ambulatory Visit: Payer: Self-pay | Admitting: Internal Medicine

## 2023-09-07 DIAGNOSIS — E039 Hypothyroidism, unspecified: Secondary | ICD-10-CM

## 2023-09-07 DIAGNOSIS — I10 Essential (primary) hypertension: Secondary | ICD-10-CM

## 2023-09-07 NOTE — Telephone Encounter (Signed)
 Requested Prescriptions  Pending Prescriptions Disp Refills   levothyroxine (SYNTHROID) 88 MCG tablet [Pharmacy Med Name: LEVOTHYROXINE 88 MCG TAB[*]] 90 tablet 1    Sig: TAKE ONE TABLET BY MOUTH ONE TIME DAILY     Endocrinology:  Hypothyroid Agents Failed - 09/07/2023 11:02 AM      Failed - TSH in normal range and within 360 days    TSH  Date Value Ref Range Status  06/29/2023 0.07 (L) 0.40 - 4.50 mIU/L Final         Passed - Valid encounter within last 12 months    Recent Outpatient Visits           2 months ago Annual physical exam   [facility] Rockney Cid, DO       Future Appointments             In 3 months Rockney Cid, DO [facility], [facility]

## 2023-09-08 NOTE — Telephone Encounter (Signed)
  The original prescription was discontinued on 05/10/2023 by Rockney Cid, DO for the following reason: Dose change.    Requested Prescriptions  Pending Prescriptions Disp Refills   lisinopril (ZESTRIL) 5 MG tablet [Pharmacy Med Name: LISINOPRIL 5 MG TAB] 90 tablet 1    Sig: TAKE ONE TABLET BY MOUTH ONE TIME DAILY     Cardiovascular:  ACE Inhibitors Passed - 09/08/2023  4:56 PM      Passed - Cr in normal range and within 180 days    Creat  Date Value Ref Range Status  06/29/2023 0.85 0.50 - 1.05 mg/dL Final         Passed - K in normal range and within 180 days    Potassium  Date Value Ref Range Status  06/29/2023 4.3 3.5 - 5.3 mmol/L Final         Passed - Patient is not pregnant      Passed - Last BP in normal range    BP Readings from Last 1 Encounters:  06/29/23 120/84         Passed - Valid encounter within last 6 months    Recent Outpatient Visits           2 months ago Annual physical exam   [facility] Rockney Cid, DO       Future Appointments             In 3 months Rockney Cid, DO  [facility]             levothyroxine (SYNTHROID) 125 MCG tablet [Pharmacy Med Name: LEVOTHYROXINE 125 MCG TAB[*]] 90 tablet 0    Sig: TAKE ONE TABLET BY MOUTH ONCE DAILY     Endocrinology:  Hypothyroid Agents Failed - 09/08/2023  4:56 PM      Failed - TSH in normal range and within 360 days    TSH  Date Value Ref Range Status  06/29/2023 0.07 (L) 0.40 - 4.50 mIU/L Final         Passed - Valid encounter within last 12 months    Recent Outpatient Visits           2 months ago Annual physical exam   [facility] Rockney Cid, DO       Future Appointments             In 3 months Rockney Cid, DO [facility], [facility]

## 2023-09-17 IMAGING — DX DG CHEST 2V
2 series · 2 of 2 positions shown · non-contrast
Comparison: None.

CLINICAL DATA: Cough

EXAM:
CHEST - 2 VIEW

[chest pa]
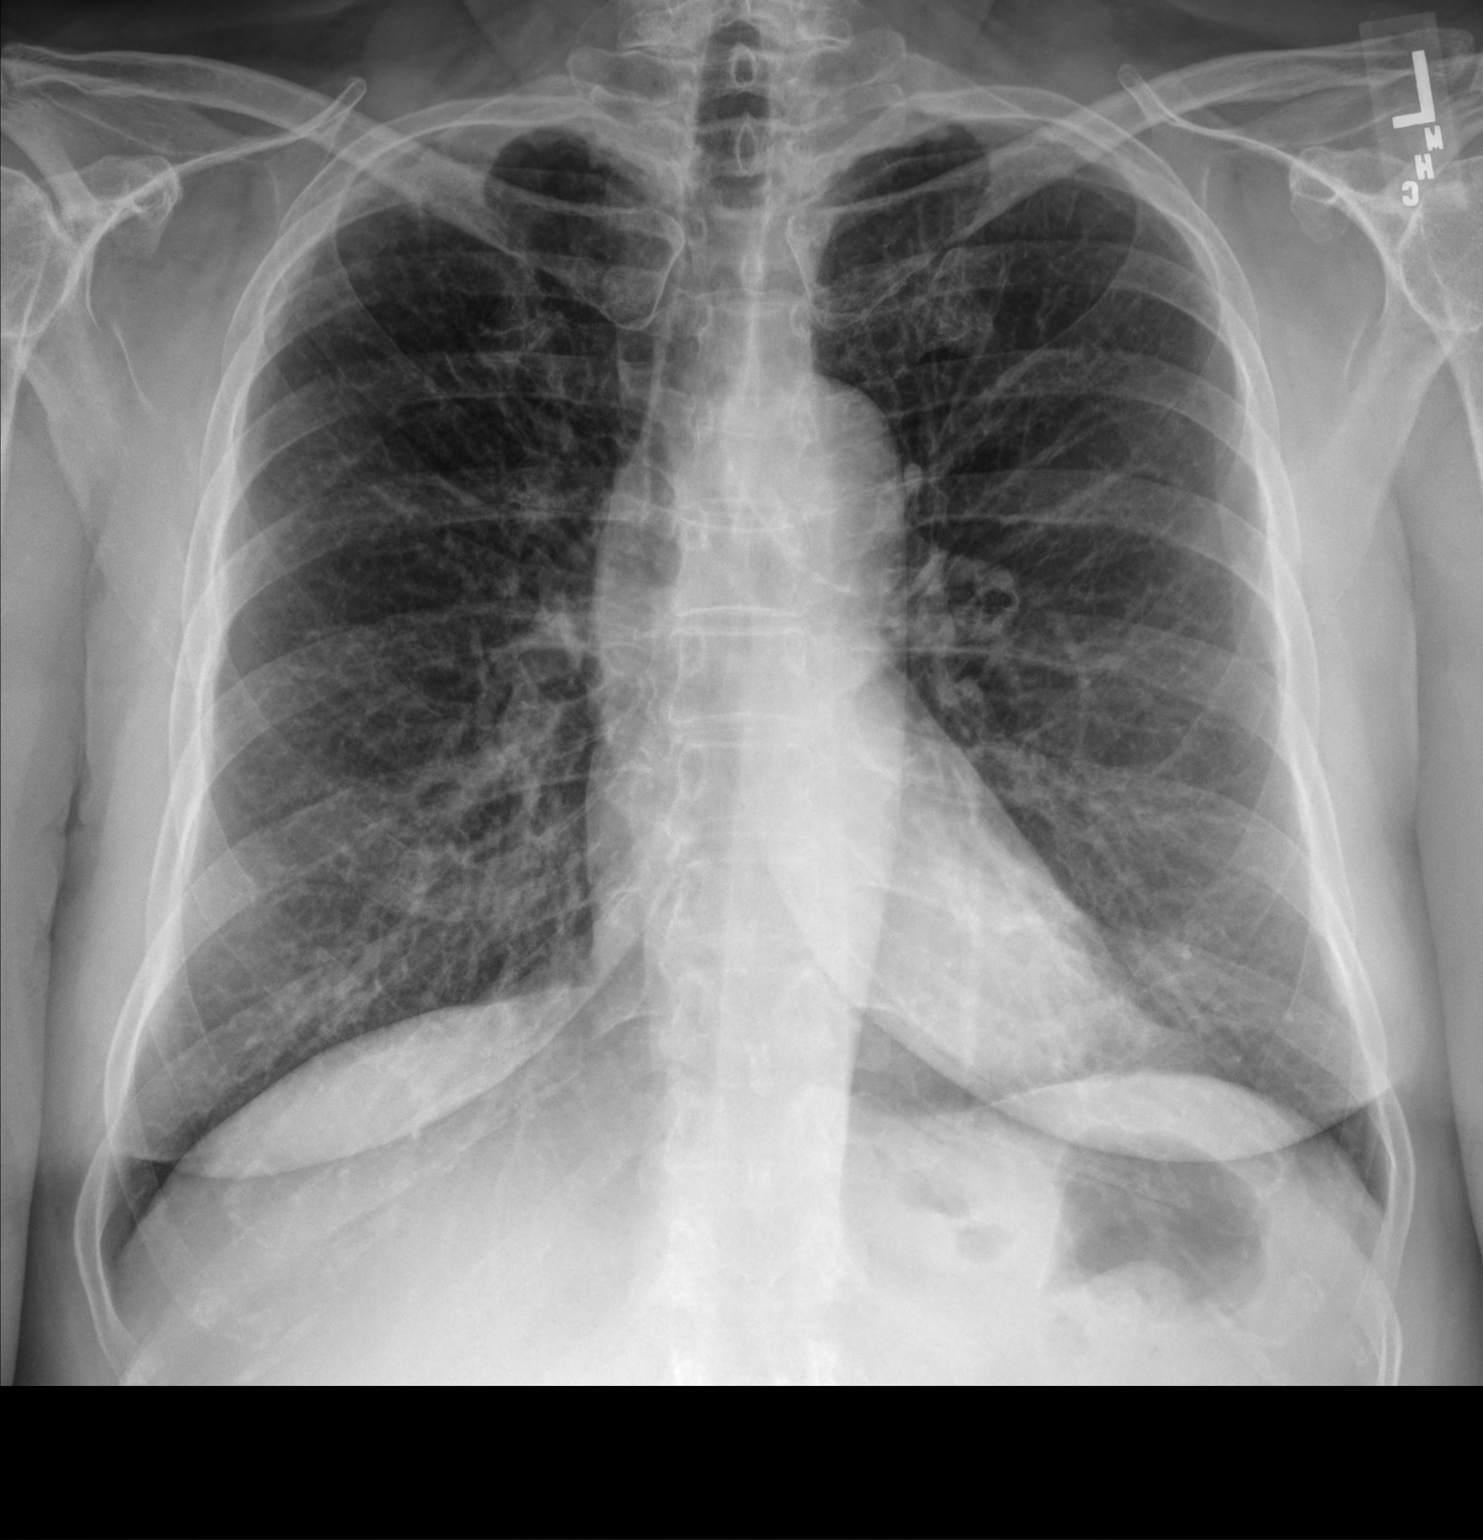

[chest lat]
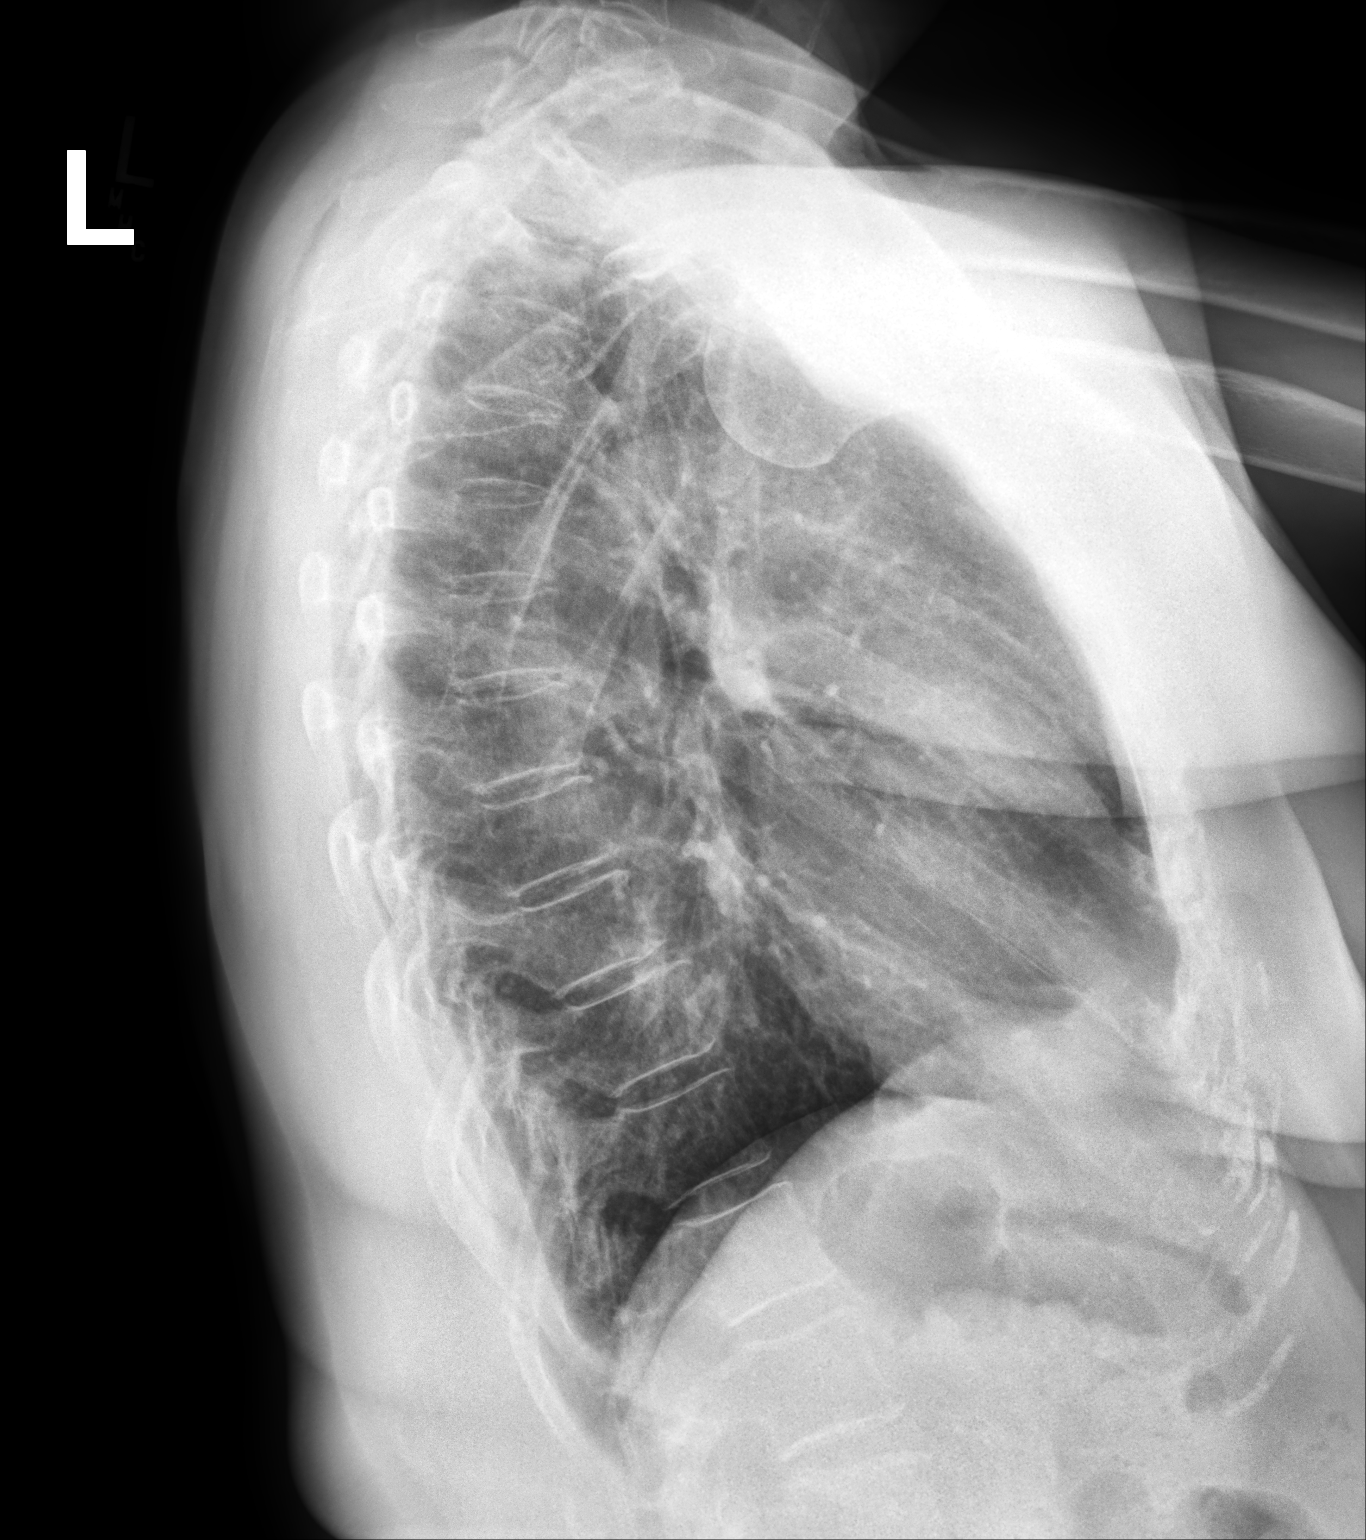

[2 of 2 positions shown; findings below may reference images not displayed]

FINDINGS: Heart size is normal. Mediastinum appears within normal limits. No
focal consolidation identified in the lungs. Mildly prominent
interstitial lung markings, upper lobe predominant with biapical
pleural thickening. No pleural effusion or pneumothorax.
IMPRESSION: No focal consolidation identified.  Likely COPD changes.

## 2023-09-21 ENCOUNTER — Encounter: Payer: Self-pay | Admitting: Internal Medicine

## 2023-09-21 ENCOUNTER — Other Ambulatory Visit: Payer: Self-pay | Admitting: Internal Medicine

## 2023-09-21 DIAGNOSIS — E039 Hypothyroidism, unspecified: Secondary | ICD-10-CM

## 2023-09-22 DIAGNOSIS — E039 Hypothyroidism, unspecified: Secondary | ICD-10-CM | POA: Diagnosis not present

## 2023-09-22 LAB — TSH: TSH: 0.43 m[IU]/L (ref 0.40–4.50)

## 2023-09-23 ENCOUNTER — Encounter: Payer: Self-pay | Admitting: Internal Medicine

## 2023-12-06 ENCOUNTER — Other Ambulatory Visit: Payer: Self-pay | Admitting: Internal Medicine

## 2023-12-06 DIAGNOSIS — I10 Essential (primary) hypertension: Secondary | ICD-10-CM

## 2023-12-08 NOTE — Telephone Encounter (Signed)
 Requested Prescriptions  Pending Prescriptions Disp Refills   hydrochlorothiazide  (HYDRODIURIL ) 12.5 MG tablet [Pharmacy Med Name: HYDROCHLOROTHIAZIDE  12.5 MG TAB] 90 tablet 0    Sig: TAKE ONE TABLET BY MOUTH ONE TIME DAILY     Cardiovascular: Diuretics - Thiazide Passed - 12/08/2023 11:28 AM      Passed - Cr in normal range and within 180 days    Creat  Date Value Ref Range Status  06/29/2023 0.85 0.50 - 1.05 mg/dL Final         Passed - K in normal range and within 180 days    Potassium  Date Value Ref Range Status  06/29/2023 4.3 3.5 - 5.3 mmol/L Final         Passed - Na in normal range and within 180 days    Sodium  Date Value Ref Range Status  06/29/2023 138 135 - 146 mmol/L Final  11/16/2016 141 134 - 144 mmol/L Final         Passed - Last BP in normal range    BP Readings from Last 1 Encounters:  06/29/23 120/84         Passed - Valid encounter within last 6 months    Recent Outpatient Visits           5 months ago Annual physical exam   [facility] Bernardo Fend, DO       Future Appointments             In 2 weeks Bernardo Fend, DO [facility]

## 2023-12-14 ENCOUNTER — Telehealth: Payer: Self-pay

## 2023-12-14 NOTE — Telephone Encounter (Signed)
 Copied from CRM [id number]. Topic: Clinical - Request for Lab/Test Order >> Dec 14, 2023  8:48 AM Myrick T wrote: Reason for CRM: patient called to requests labs ordered before her appt on 8/5. Please /fu with patient once orders have been placed

## 2023-12-19 ENCOUNTER — Other Ambulatory Visit: Payer: Self-pay | Admitting: Internal Medicine

## 2023-12-19 DIAGNOSIS — E039 Hypothyroidism, unspecified: Secondary | ICD-10-CM

## 2023-12-20 ENCOUNTER — Encounter: Payer: Self-pay | Admitting: Internal Medicine

## 2023-12-27 ENCOUNTER — Other Ambulatory Visit: Payer: Self-pay

## 2023-12-27 ENCOUNTER — Ambulatory Visit (INDEPENDENT_AMBULATORY_CARE_PROVIDER_SITE_OTHER): Payer: Medicare HMO | Admitting: Internal Medicine

## 2023-12-27 ENCOUNTER — Encounter: Payer: Self-pay | Admitting: Internal Medicine

## 2023-12-27 VITALS — BP 132/84 | HR 82 | Temp 98.0°F | Resp 16 | Ht 62.5 in | Wt 198.0 lb

## 2023-12-27 DIAGNOSIS — I10 Essential (primary) hypertension: Secondary | ICD-10-CM

## 2023-12-27 DIAGNOSIS — E7800 Pure hypercholesterolemia, unspecified: Secondary | ICD-10-CM

## 2023-12-27 DIAGNOSIS — E039 Hypothyroidism, unspecified: Secondary | ICD-10-CM | POA: Diagnosis not present

## 2023-12-27 NOTE — Progress Notes (Signed)
 Established Patient Office Visit  Subjective    Patient ID: Vanessa Austin, female    DOB: [birth year]  Age: 68 y.o. MRN: [id number]  CC:  Chief Complaint  Patient presents with   Medical Management of Chronic Issues    6 month recheck    HPI Vanessa Austin presents to follow up on chronic medical conditions.   Discussed the use of AI scribe software for clinical note transcription with the patient, who gave verbal consent to proceed.  History of Present Illness Vanessa Austin is a 68 year old female with hypertension, hypothyroidism, and elevated cholesterol who presents for follow-up on thyroid  function and blood pressure management.  She has not completed her thyroid  labs yet but plans to do so today. She is on Synthroid  88 mcg and has discontinued Cytomel. Her thyroid  levels were reportedly improved at the last check. Hypertension is managed with low doses of hydrochlorothiazide  and lisinopril  without issues.  She manages elevated cholesterol through diet, cooking at home, avoiding red meats, and not consuming sugary beverages. She recalls losing weight in 2018 through dietary changes and increased activity. Currently, she eats two meals a day, avoids snacking, and does not consume soft drinks. She is active in her garden and has resumed walking, utilizing a nearby park for exercise.  She is retired, works remotely part-time, and is adjusting to a new daily routine.  Hypertension: -Medications: Lisinopril  5 mg, hydrochlorothiazide  12.5 mg - has been on the regimen for years -Patient is compliant with above medications and reports no side effects. -Checking BP at home (average): doesn't check -Denies any SOB, CP, vision changes, LE edema or symptoms of hypotension  Hypothyroidism/Hx of Graves: -Radioactive iodine in 1987 -Medications: Levothyroxine  decreased to 88 mcg, no longer on Cytomel 5 mcg 4 tablets a day - feels good on this regimen -Patient is compliant with the  above medication (s) at the above dose and reports no medication side effects.  -Denies cold./heat intolerance, skin changes, anxiety/palpitations, does note more weight gain recently.  -Last TSH 5/25 0.43  The 10-year ASCVD risk score (Arnett DK, et al., 2019) is: 8.5%   Values used to calculate the score:     Age: 20 years     Clincally relevant sex: Female     Is Non-Hispanic African American: No     Diabetic: No     Tobacco smoker: No     Systolic Blood Pressure: 132 mmHg     Is BP treated: Yes     HDL Cholesterol: 83 mg/dL     Total Cholesterol: 259 mg/dL  Environmental Allergies: -Takes Zyrtec daily, symptoms well controlled  Health Maintenance: -Blood work UTD -Mammogram 9/24 Birads-1 -Pap UTD 2/24, getting records -Colonoscopy 05/2018, repeat in 10 years   Outpatient Encounter Medications as of 12/27/2023  Medication Sig   hydrochlorothiazide  (HYDRODIURIL ) 12.5 MG tablet TAKE ONE TABLET BY MOUTH ONE TIME DAILY   levothyroxine  (SYNTHROID ) 88 MCG tablet TAKE ONE TABLET BY MOUTH ONE TIME DAILY   lisinopril  (ZESTRIL ) 5 MG tablet TAKE ONE TABLET BY MOUTH ONE TIME DAILY   No facility-administered encounter medications on file as of 12/27/2023.    Past Medical History:  Diagnosis Date   Allergy    Hypertension    Hypothyroidism    Thyroid  disease     Past Surgical History:  Procedure Laterality Date   BILATERAL CARPAL TUNNEL RELEASE Bilateral 10/01/2010   Dr. Helayne Glenn   BREAST EXCISIONAL BIOPSY Right 01/27/2016   papilloma  and complex sclerosing lesion removed   BREAST LUMPECTOMY WITH NEEDLE LOCALIZATION Right 01/27/2016   Procedure: BREAST LUMPECTOMY WITH NEEDLE LOCALIZATION;  Surgeon: Larinda Unknown Sharps, MD;  Location: [facility] ORS;  Service: General;  Laterality: Right;    Family History  Problem Relation Age of Onset   Hypertension Mother    Brain cancer Mother    Hypertension Father    Heart disease Father    Hypertension Brother    Breast cancer Neg Hx      Social History   Socioeconomic History   Marital status: Widowed    Spouse name: Kayla   Number of children: 3   Years of education: College   Highest education level: Not on file  Occupational History   Occupation: [employer]    Comment: Full-Time  Tobacco Use   Smoking status: Former    Current packs/day: 0.00    Average packs/day: 0.3 packs/day for 10.0 years (2.5 ttl pk-yrs)    Types: Cigarettes    Start date: 05/23/1970    Quit date: 05/23/1980    Years since quitting: 43.6   Smokeless tobacco: Never  Vaping Use   Vaping status: Never Used  Substance and Sexual Activity   Alcohol use: Yes    Alcohol/week: 7.0 standard drinks of alcohol    Types: 7 Glasses of wine per week   Drug use: No   Sexual activity: Yes  Other Topics Concern   Not on file  Social History Narrative   Not on file   Social Drivers of Health   Financial Resource Strain: Low Risk  (06/29/2023)   Overall Financial Resource Strain (CARDIA)    Difficulty of Paying Living Expenses: Not hard at all  Food Insecurity: No Food Insecurity (06/29/2023)   Hunger Vital Sign    Worried About Running Out of Food in the Last Year: Never true    Ran Out of Food in the Last Year: Never true  Transportation Needs: No Transportation Needs (06/29/2023)   PRAPARE - Administrator, Civil Service (Medical): No    Lack of Transportation (Non-Medical): No  Physical Activity: Insufficiently Active (06/29/2023)   Exercise Vital Sign    Days of Exercise per Week: 4 days    Minutes of Exercise per Session: 30 min  Stress: No Stress Concern Present (06/29/2023)   [employer] of Occupational Health - Occupational Stress Questionnaire    Feeling of Stress : Only a little  Social Connections: Socially Isolated (06/29/2023)   Social Connection and Isolation Panel    Frequency of Communication with Friends and Family: More than three times a week    Frequency of Social Gatherings with Friends and Family: More  than three times a week    Attends Religious Services: Never    Database administrator or Organizations: No    Attends Banker Meetings: Never    Marital Status: Widowed  Intimate Partner Violence: Not At Risk (06/29/2023)   Humiliation, Afraid, Rape, and Kick questionnaire    Fear of Current or Ex-Partner: No    Emotionally Abused: No    Physically Abused: No    Sexually Abused: No    Review of Systems  All other systems reviewed and are negative.       Objective    BP 132/84 (Cuff Size: Large)   Pulse 82   Temp 98 F (36.7 C) (Oral)   Resp 16   Ht 5' 2.5 (1.588 m)   Wt 198 lb (89.8  kg)   SpO2 95%   BMI 35.64 kg/m   Physical Exam Constitutional:      Appearance: Normal appearance.  HENT:     Head: Normocephalic and atraumatic.  Eyes:     Conjunctiva/sclera: Conjunctivae normal.  Cardiovascular:     Rate and Rhythm: Normal rate and regular rhythm.  Pulmonary:     Effort: Pulmonary effort is normal.     Breath sounds: Normal breath sounds.  Musculoskeletal:     Right lower leg: No edema.     Left lower leg: No edema.  Skin:    General: Skin is warm and dry.  Neurological:     General: No focal deficit present.     Mental Status: She is alert. Mental status is at baseline.  Psychiatric:        Mood and Affect: Mood normal.        Behavior: Behavior normal.         Assessment & Plan:   Assessment & Plan Primary hypothyroidism Managed with Synthroid  88 mcg. Thyroid  function improved, pending current lab results. - Order thyroid  panel today. - Reassess thyroid  function based on lab results. - Plan follow-up in six months for annual review.  Essential hypertension Well-controlled with blood pressure of 132/84 mmHg on hydrochlorothiazide  and lisinopril . - Continue current antihypertensive regimen with hydrochlorothiazide  and lisinopril .  Hyperlipidemia Slightly elevated cholesterol, likely genetic. 10-year cardiovascular risk is 8.5%,  borderline. Dietary modifications recommended. - Implement dietary changes to reduce red meats and fatty processed foods. - Recheck cholesterol levels in six months. - Consider medication if cholesterol levels remain unchanged after dietary modifications.   Return in about 6 months (around 06/28/2024).   Sharyle Fischer, DO

## 2023-12-28 ENCOUNTER — Ambulatory Visit: Payer: Self-pay | Admitting: Internal Medicine

## 2023-12-28 DIAGNOSIS — E039 Hypothyroidism, unspecified: Secondary | ICD-10-CM

## 2023-12-28 LAB — THYROID PANEL WITH TSH
Free Thyroxine Index: 2.4 (ref 1.4–3.8)
T3 Uptake: 31 % (ref 22–35)
T4, Total: 7.6 ug/dL (ref 5.1–11.9)
TSH: 16.53 m[IU]/L — ABNORMAL HIGH (ref 0.40–4.50)

## 2023-12-28 MED ORDER — LEVOTHYROXINE SODIUM 100 MCG PO TABS: ORAL_TABLET | ORAL | 1 refills | Status: DC

## 2023-12-29 ENCOUNTER — Telehealth: Admitting: Emergency Medicine

## 2023-12-29 ENCOUNTER — Ambulatory Visit
Admission: RE | Admit: 2023-12-29 | Discharge: 2023-12-29 | Disposition: A | Discharge status: Home / Self Care | Source: Ambulatory Visit | Attending: Emergency Medicine | Admitting: Emergency Medicine

## 2023-12-29 VITALS — BP 163/96 | HR 71 | Temp 99.2°F | Resp 18

## 2023-12-29 DIAGNOSIS — N3000 Acute cystitis without hematuria: Secondary | ICD-10-CM | POA: Diagnosis not present

## 2023-12-29 DIAGNOSIS — R300 Dysuria: Secondary | ICD-10-CM

## 2023-12-29 LAB — POCT URINE DIPSTICK
Bilirubin, UA: NEGATIVE
Glucose, UA: 100 mg/dL — AB
Ketones, POC UA: NEGATIVE mg/dL
Nitrite, UA: POSITIVE — AB
POC PROTEIN,UA: NEGATIVE
Spec Grav, UA: 1.015 (ref 1.010–1.025)
Urobilinogen, UA: 0.2 U/dL
pH, UA: 6.5 (ref 5.0–8.0)

## 2023-12-29 MED ORDER — NITROFURANTOIN MONOHYD MACRO 100 MG PO CAPS
100.0000 mg | ORAL_CAPSULE | Freq: Two times a day (BID) | ORAL | 0 refills | Status: AC
Start: 2023-12-29 — End: ?

## 2023-12-29 NOTE — ED Provider Notes (Signed)
 [name]    CSN: [id number] Arrival date & time: 12/29/23  1639      History   Chief Complaint Chief Complaint  Patient presents with   Urinary Frequency    I believe I have a Urinary Tract infection - Entered by patient    HPI Vanessa Austin is a 68 y.o. female.   Presents for evaluation of urinary frequency, dysuria and lower abdominal pressure present for 1 to 2 days.  Has attempted use of d-mannose and Azo which has been somewhat helpful.  Denies hematuria, flank pain, fever or vaginal symptoms.  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 Helayne Glenn   BREAST EXCISIONAL BIOPSY Right 01/27/2016   papilloma and complex sclerosing lesion removed   BREAST LUMPECTOMY WITH NEEDLE LOCALIZATION Right 01/27/2016   Procedure: BREAST LUMPECTOMY WITH NEEDLE LOCALIZATION;  Surgeon: Larinda Unknown Sharps, MD;  Location: [facility] ORS;  Service: General;  Laterality: Right;    OB History     Gravida  3   Para  3   Term      Preterm      AB      Living         SAB      IAB      Ectopic      Multiple      Live Births               Home Medications    Prior to Admission medications   Medication Sig Start Date End Date Taking? Authorizing Provider  hydrochlorothiazide  (HYDRODIURIL ) 12.5 MG tablet TAKE ONE TABLET BY MOUTH ONE TIME DAILY 12/08/23    Bernardo Fend, DO  levothyroxine  (SYNTHROID ) 100 MCG tablet To alternate every other day with 88 mcg dose. 12/28/23   Bernardo Fend, DO  levothyroxine  (SYNTHROID ) 88 MCG tablet TAKE ONE TABLET BY MOUTH ONE TIME DAILY 09/07/23   Bernardo Fend, DO  lisinopril  (ZESTRIL ) 5 MG tablet TAKE ONE TABLET BY MOUTH ONE TIME DAILY 09/08/23   Bernardo Fend, DO  nitrofurantoin , macrocrystal-monohydrate, (MACROBID ) 100 MG capsule Take 1 capsule (100 mg total) by mouth 2 (two) times daily. 12/29/23  Yes Vivica Dobosz, Shelba SAUNDERS, NP    Family History Family History  Problem Relation Age of Onset   Hypertension Mother    Brain cancer Mother    Hypertension Father    Heart disease Father    Hypertension Brother    Breast cancer Neg Hx     Social History Social History   Tobacco Use   Smoking status: Former    Current packs/day: 0.00    Average packs/day: 0.3 packs/day for 10.0 years (2.5 ttl pk-yrs)    Types: Cigarettes    Start date: 05/23/1970    Quit date: 05/23/1980    Years since quitting: 43.6   Smokeless tobacco: Never  Vaping Use  Vaping status: Never Used  Substance Use Topics   Alcohol use: Yes    Alcohol/week: 7.0 standard drinks of alcohol    Types: 7 Glasses of wine per week   Drug use: No     Allergies   Etodolac, Penicillins, Sulfa antibiotics, and Meloxicam   Review of Systems Review of Systems   Physical Exam Triage Vital Signs ED Triage Vitals [12/29/23 1733]  Encounter Vitals Group     BP (!) 163/96     Girls Systolic BP Percentile      Girls Diastolic BP Percentile      Boys Systolic BP Percentile      Boys Diastolic BP Percentile      Pulse Rate 71     Resp 18     Temp 99.2 F (37.3 C)     Temp Source Oral     SpO2 93 %     Weight      Height      Head Circumference      Peak Flow      Pain Score 0     Pain Loc      Pain Education      Exclude from Growth Chart    No data found.  Updated Vital Signs BP (!) 163/96   Pulse 71   Temp  99.2 F (37.3 C) (Oral)   Resp 18   SpO2 93%   Visual Acuity Right Eye Distance:   Left Eye Distance:   Bilateral Distance:    Right Eye Near:   Left Eye Near:    Bilateral Near:     Physical Exam Constitutional:      Appearance: Normal appearance.  Eyes:     Extraocular Movements: Extraocular movements intact.  Pulmonary:     Effort: Pulmonary effort is normal.  Abdominal:     Tenderness: There is no abdominal tenderness. There is no right CVA tenderness, left CVA tenderness or guarding.  Neurological:     Mental Status: She is alert and oriented to person, place, and time. Mental status is at baseline.      UC Treatments / Results  Labs (all labs ordered are listed, but only abnormal results are displayed) Labs Reviewed  URINE CULTURE  POCT URINE DIPSTICK    EKG   Radiology No results found.  Procedures Procedures (including critical care time)  Medications Ordered in UC Medications - No data to display  Initial Impression / Assessment and Plan / UC Course  I have reviewed the triage vital signs and the nursing notes.  Pertinent labs & imaging results that were available during my care of the patient were reviewed by me and considered in my medical decision making (see chart for details).  Acute cystitis without hematuria, dysuria  Urinalysis showing leukocytes and nitrates, sent for culture, discussed findings with patient, prescribed Macrobid  and recommended over-the-counter medications and nonpharmacological supportive care and advised follow-up if symptoms persist worsen or recur Final Clinical Impressions(s) / UC Diagnoses   Final diagnoses:  Dysuria  Acute cystitis without hematuria     Discharge Instructions      Your urinalysis shows Janis Sol blood cells and nitrates which are indicative of infection, your urine will be sent to the lab to determine exactly which bacteria is present, if any changes need to be made to your medications you will  be notified  Begin use of Macrobid  twice daily for 5 days  You may use over-the-counter Azo to help minimize your symptoms until antibiotic  removes bacteria, this medication will turn your urine orange  Increase your fluid intake through use of water  As always practice good hygiene, wiping front to back and avoidance of scented vaginal products to prevent further irritation  If symptoms continue to persist after use of medication or recur please follow-up with urgent care or your primary doctor as needed    ED Prescriptions     Medication Sig Dispense Auth. Provider   nitrofurantoin , macrocrystal-monohydrate, (MACROBID ) 100 MG capsule Take 1 capsule (100 mg total) by mouth 2 (two) times daily. 10 capsule Keeara Frees R, NP      PDMP not reviewed this encounter.   Teresa Shelba SAUNDERS, NP 12/29/23 1736

## 2023-12-29 NOTE — Progress Notes (Signed)
  Hi Haydyn, I am very limited in what I can treat by evisit.   Because of your age, I feel your condition warrants further evaluation and I recommend that you be seen in a face-to-face visit so you can have your urine tested.   I am sorry I am not able to help you by evisit.    NOTE: There will be NO CHARGE for this E-Visit   If you are having a true medical emergency, please call 911.     For an urgent face to face visit, [facility] has multiple urgent care centers for your convenience.  Click the link below for the full list of locations and hours, walk-in wait times, appointment scheduling options and driving directions:  Urgent Care - [place], [place], [place], [place], [place], [place]  [place]     Your MyChart E-visit questionnaire answers were reviewed by a board certified advanced clinical practitioner to complete your personal care plan based on your specific symptoms.    Thank you for using e-Visits.

## 2023-12-29 NOTE — ED Triage Notes (Addendum)
 Patient here for urinary frequency, painful urination lower abdominal pressure 1.5 days . Took AZO with mild relief.

## 2023-12-29 NOTE — Progress Notes (Signed)
 Pt did not see my message explaining she would need in person care. I am sorry I cannot help her by video visit or evisit.

## 2023-12-29 NOTE — Discharge Instructions (Signed)
Your urinalysis shows Vanessa Austin blood cells and nitrates which are indicative of infection, your urine will be sent to the lab to determine exactly which bacteria is present, if any changes need to be made to your medications you will be notified  Begin use of Macrobid twice daily for 5 days   You may use over-the-counter Azo to help minimize your symptoms until antibiotic removes bacteria, this medication will turn your urine orange  Increase your fluid intake through use of water  As always practice good hygiene, wiping front to back and avoidance of scented vaginal products to prevent further irritation  If symptoms continue to persist after use of medication or recur please follow-up with urgent care or your primary doctor as needed  

## 2023-12-31 LAB — URINE CULTURE: Culture: 60000 — AB

## 2024-01-02 ENCOUNTER — Ambulatory Visit (HOSPITAL_COMMUNITY): Payer: Self-pay

## 2024-01-02 MED ORDER — CEPHALEXIN 500 MG PO CAPS: 500.0000 mg | ORAL_CAPSULE | Freq: Four times a day (QID) | ORAL | 0 refills | Status: AC

## 2024-01-02 NOTE — Telephone Encounter (Signed)
 Because patient is over the age of 59, has tolerated Keflex  well in the past and has taken Keflex  in the past, recommend avoid ciprofloxacin and begin Keflex .  Prescription sent to pharmacy on record.

## 2024-02-13 ENCOUNTER — Other Ambulatory Visit: Payer: Self-pay | Admitting: Internal Medicine

## 2024-02-13 DIAGNOSIS — Z1231 Encounter for screening mammogram for malignant neoplasm of breast: Secondary | ICD-10-CM

## 2024-02-29 NOTE — Progress Notes (Signed)
 Vanessa Austin                                          MRN: [id number]   02/29/2024   The [facility] Quality Team Specialist reviewed this patient medical record for the purposes of chart review for care gap closure. The following were reviewed: chart review for care gap closure-controlling blood pressure.    [facility]BCI Quality Team

## 2024-03-05 ENCOUNTER — Other Ambulatory Visit: Payer: Self-pay | Admitting: Internal Medicine

## 2024-03-05 DIAGNOSIS — I10 Essential (primary) hypertension: Secondary | ICD-10-CM

## 2024-03-06 ENCOUNTER — Ambulatory Visit
Admission: RE | Admit: 2024-03-06 | Discharge: 2024-03-06 | Disposition: A | Discharge status: Home / Self Care | Source: Ambulatory Visit | Attending: Internal Medicine | Admitting: Internal Medicine

## 2024-03-06 DIAGNOSIS — Z1231 Encounter for screening mammogram for malignant neoplasm of breast: Secondary | ICD-10-CM | POA: Insufficient documentation

## 2024-03-08 ENCOUNTER — Ambulatory Visit: Payer: Self-pay | Admitting: Internal Medicine

## 2024-03-08 NOTE — Telephone Encounter (Signed)
 Requested Prescriptions  Pending Prescriptions Disp Refills   hydrochlorothiazide  (HYDRODIURIL ) 12.5 MG tablet [Pharmacy Med Name: HYDROCHLOROTHIAZIDE  12.5 MG TAB] 90 tablet 0    Sig: TAKE ONE TABLET BY MOUTH ONE TIME DAILY     Cardiovascular: Diuretics - Thiazide Failed - 03/08/2024 10:55 AM      Failed - Cr in normal range and within 180 days    Creat  Date Value Ref Range Status  06/29/2023 0.85 0.50 - 1.05 mg/dL Final         Failed - K in normal range and within 180 days    Potassium  Date Value Ref Range Status  06/29/2023 4.3 3.5 - 5.3 mmol/L Final         Failed - Na in normal range and within 180 days    Sodium  Date Value Ref Range Status  06/29/2023 138 135 - 146 mmol/L Final  11/16/2016 141 134 - 144 mmol/L Final         Failed - Last BP in normal range    BP Readings from Last 1 Encounters:  12/29/23 (!) 163/96         Passed - Valid encounter within last 6 months    Recent Outpatient Visits           2 months ago Essential hypertension   [facility] Bernardo Fend, DO   8 months ago Annual physical exam   [facility] Bernardo Fend, [place]

## 2024-03-13 ENCOUNTER — Other Ambulatory Visit: Payer: Self-pay | Admitting: Internal Medicine

## 2024-03-13 DIAGNOSIS — E039 Hypothyroidism, unspecified: Secondary | ICD-10-CM

## 2024-03-15 NOTE — Telephone Encounter (Signed)
 Requested Prescriptions  Pending Prescriptions Disp Refills   levothyroxine  (SYNTHROID ) 88 MCG tablet [Pharmacy Med Name: LEVOTHYROXINE  88 MCG TAB[*]] 90 tablet 0    Sig: TAKE ONE TABLET BY MOUTH ONE TIME DAILY     Endocrinology:  Hypothyroid Agents Failed - 03/15/2024  9:36 AM      Failed - TSH in normal range and within 360 days    TSH  Date Value Ref Range Status  12/27/2023 16.53 (H) 0.40 - 4.50 mIU/L Final         Passed - Valid encounter within last 12 months    Recent Outpatient Visits           2 months ago Essential hypertension   [facility] Bernardo Fend, DO   8 months ago Annual physical exam   [facility] Bernardo Fend, [place]

## 2024-03-26 ENCOUNTER — Encounter: Payer: Self-pay | Admitting: Internal Medicine

## 2024-03-27 DIAGNOSIS — H524 Presbyopia: Secondary | ICD-10-CM | POA: Diagnosis not present

## 2024-04-03 LAB — TSH: TSH: 10.7 m[IU]/L — ABNORMAL HIGH (ref 0.40–4.50)

## 2024-04-09 ENCOUNTER — Other Ambulatory Visit: Payer: Self-pay | Admitting: Internal Medicine

## 2024-04-09 DIAGNOSIS — E039 Hypothyroidism, unspecified: Secondary | ICD-10-CM

## 2024-04-12 NOTE — Telephone Encounter (Signed)
 Requested medications are due for refill today.  yes  Requested medications are on the active medications list.  yes  Last refill. 12/28/2023 #30 1 rf  Future visit scheduled.   no  Notes to clinic.  Abnormal labs.    Requested Prescriptions  Pending Prescriptions Disp Refills   levothyroxine  (SYNTHROID ) 100 MCG tablet [Pharmacy Med Name: LEVOTHYROXINE  100 MCG TAB[*]] 30 tablet 1    Sig: TAKE ONE TABLET BY MOUTH EVERY OTHER DAY **ALTERNATE WITH 88 MCG DOSE**     Endocrinology:  Hypothyroid Agents Failed - 04/12/2024  1:06 PM      Failed - TSH in normal range and within 360 days    TSH  Date Value Ref Range Status  04/02/2024 10.70 (H) 0.40 - 4.50 mIU/L Final         Passed - Valid encounter within last 12 months    Recent Outpatient Visits           3 months ago Essential hypertension   [facility] Bernardo Fend, DO   9 months ago Annual physical exam   [facility] Bernardo Fend, [place]

## 2024-05-04 NOTE — Progress Notes (Signed)
 Vanessa Austin                                          MRN: [id number]   05/04/2024   The VBCI Quality Team Specialist reviewed this patient medical record for the purposes of chart review for care gap closure. The following were reviewed: chart review for care gap closure-controlling blood pressure.    VBCI Quality Team

## 2024-06-03 ENCOUNTER — Other Ambulatory Visit: Payer: Self-pay | Admitting: Internal Medicine

## 2024-06-03 DIAGNOSIS — I10 Essential (primary) hypertension: Secondary | ICD-10-CM

## 2024-06-05 NOTE — Telephone Encounter (Signed)
 Requested Prescriptions  Pending Prescriptions Disp Refills   hydrochlorothiazide  (HYDRODIURIL ) 12.5 MG tablet [Pharmacy Med Name: HYDROCHLOROTHIAZIDE  12.5 MG TAB] 90 tablet 0    Sig: TAKE ONE TABLET BY MOUTH ONE TIME DAILY     Cardiovascular: Diuretics - Thiazide Failed - 06/05/2024  8:50 AM      Failed - Cr in normal range and within 180 days    Creat  Date Value Ref Range Status  06/29/2023 0.85 0.50 - 1.05 mg/dL Final         Failed - K in normal range and within 180 days    Potassium  Date Value Ref Range Status  06/29/2023 4.3 3.5 - 5.3 mmol/L Final         Failed - Na in normal range and within 180 days    Sodium  Date Value Ref Range Status  06/29/2023 138 135 - 146 mmol/L Final  11/16/2016 141 134 - 144 mmol/L Final         Failed - Last BP in normal range    BP Readings from Last 1 Encounters:  12/29/23 (!) 163/96         Passed - Valid encounter within last 6 months    Recent Outpatient Visits           5 months ago Essential hypertension   [facility] Bernardo Fend, DO   11 months ago Annual physical exam   [facility] Bernardo Fend, [place]

## 2024-06-11 ENCOUNTER — Other Ambulatory Visit: Payer: Self-pay | Admitting: Internal Medicine

## 2024-06-11 DIAGNOSIS — I10 Essential (primary) hypertension: Secondary | ICD-10-CM

## 2024-06-12 NOTE — Progress Notes (Signed)
 Vanessa Austin                                          MRN: [id number]   06/12/2024   The VBCI Quality Team Specialist reviewed this patient medical record for the purposes of chart review for care gap closure. The following were reviewed: chart review for care gap closure-controlling blood pressure.    VBCI Quality Team

## 2024-06-12 NOTE — Telephone Encounter (Signed)
 Requested Prescriptions  Pending Prescriptions Disp Refills   lisinopril  (ZESTRIL ) 5 MG tablet [Pharmacy Med Name: LISINOPRIL  5 MG TAB] 90 tablet 0    Sig: TAKE ONE TABLET BY MOUTH ONE TIME DAILY     Cardiovascular:  ACE Inhibitors Failed - 06/12/2024  1:30 PM      Failed - Cr in normal range and within 180 days    Creat  Date Value Ref Range Status  06/29/2023 0.85 0.50 - 1.05 mg/dL Final         Failed - K in normal range and within 180 days    Potassium  Date Value Ref Range Status  06/29/2023 4.3 3.5 - 5.3 mmol/L Final         Failed - Last BP in normal range    BP Readings from Last 1 Encounters:  12/29/23 (!) 163/96         Passed - Patient is not pregnant      Passed - Valid encounter within last 6 months    Recent Outpatient Visits           5 months ago Essential hypertension   [facility] Bernardo Fend, DO   11 months ago Annual physical exam   [facility] Bernardo Fend, [place]
